# Patient Record
Sex: Male | Born: 1965 | Race: White | Hispanic: No | Marital: Married | Smoking: Never smoker
Health system: Southern US, Community
[De-identification: ages and names within clinical notes are randomized; demographics above are authoritative.]

## PROBLEM LIST (undated history)

## (undated) DIAGNOSIS — E559 Vitamin D deficiency, unspecified: Secondary | ICD-10-CM

## (undated) DIAGNOSIS — E0500 Thyrotoxicosis with diffuse goiter without thyrotoxic crisis or storm: Secondary | ICD-10-CM

## (undated) DIAGNOSIS — E039 Hypothyroidism, unspecified: Secondary | ICD-10-CM

## (undated) DIAGNOSIS — J309 Allergic rhinitis, unspecified: Secondary | ICD-10-CM

## (undated) DIAGNOSIS — S5400XA Injury of ulnar nerve at forearm level, unspecified arm, initial encounter: Secondary | ICD-10-CM

## (undated) DIAGNOSIS — E785 Hyperlipidemia, unspecified: Secondary | ICD-10-CM

## (undated) DIAGNOSIS — Z923 Personal history of irradiation: Secondary | ICD-10-CM

## (undated) HISTORY — DX: Allergic rhinitis, unspecified: J30.9

## (undated) HISTORY — DX: Thyrotoxicosis with diffuse goiter without thyrotoxic crisis or storm: E05.00

## (undated) HISTORY — DX: Hypothyroidism, unspecified: E03.9

## (undated) HISTORY — DX: Injury of ulnar nerve at forearm level, unspecified arm, initial encounter: S54.00XA

## (undated) HISTORY — DX: Hyperlipidemia, unspecified: E78.5

## (undated) HISTORY — DX: Personal history of irradiation: Z92.3

## (undated) HISTORY — DX: Vitamin D deficiency, unspecified: E55.9

---
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
## 1988-08-27 HISTORY — PX: HAND SURGERY: SHX662

## 1994-08-27 HISTORY — PX: OTHER SURGICAL HISTORY: SHX169

## 2008-11-10 HISTORY — PX: LACERATION REPAIR: SHX5168

## 2016-04-12 ENCOUNTER — Encounter: Payer: Self-pay | Admitting: Cardiovascular Disease

## 2016-04-19 ENCOUNTER — Ambulatory Visit: Payer: Self-pay | Admitting: Cardiovascular Disease

## 2016-04-20 ENCOUNTER — Encounter (INDEPENDENT_AMBULATORY_CARE_PROVIDER_SITE_OTHER): Payer: Self-pay

## 2016-04-20 ENCOUNTER — Encounter: Payer: Self-pay | Admitting: Cardiovascular Disease

## 2016-04-20 ENCOUNTER — Ambulatory Visit (INDEPENDENT_AMBULATORY_CARE_PROVIDER_SITE_OTHER): Payer: BLUE CROSS/BLUE SHIELD | Admitting: Cardiovascular Disease

## 2016-04-20 VITALS — BP 120/78 | HR 89 | Ht 72.0 in | Wt 228.3 lb

## 2016-04-20 DIAGNOSIS — R0789 Other chest pain: Secondary | ICD-10-CM

## 2016-04-20 NOTE — Progress Notes (Signed)
Cardiology Office Note Date:  04/20/2016   ID:  Philip Miller, DOB 1965/11/15, MRN [id number]  PCP:  London Pepper, MD  Cardiologist:  Sherren Mocha, MD    Chief Complaint  Patient presents with  . New Patient (Initial Visit)    family Hx      History of Present Illness: Philip Miller is a 50 y.o. male who presents for initial cardiac evaluation. He is well-known to me as I have taken care of multiple family members. He is concerned about cardiovascular risk because of a strong family hx of CAD in his parents as well as other relatives (uncles).   He swims three days per week. He has no exertional symptoms unless he really pushes it. He works as an Art gallery manager and at times he is under a great deal of stress. He used to run for exercise but has been limited by bone spurs.   He had chest tightness one year ago when he was under a lot of stress. This has completely resolved. He has never had exertional chest tightness. No dyspnea, edema, palpitations. No other complaints.   He is treated for hypertension and has been on Diovan for about one year. He is treated for hypercholesterolemia and thinks his numbers have been very good but he doesn't know the specifics. He's been on a statin drug for several years. Lipids have been followed by Dr Altheimer. He sees him every 6 months with hx of Grave's disease.   Past Medical History:  Diagnosis Date  . Allergic rhinitis   . Dyslipidemia   . Graves disease   . Hypothyroidism   . S/P radioactive iodine thyroid ablation   . Ulnar nerve injury    left side  . Vitamin D deficiency     Past Surgical History:  Procedure Laterality Date  . colonscopy  1996  . HAND SURGERY Right 1990   place pin in hand  . LACERATION REPAIR Left 11/10/2008  . WISDOM TOOTH EXTRACTION  1986    Current Outpatient Prescriptions  Medication Sig Dispense Refill  . atorvastatin (LIPITOR) 20 MG tablet Take 20 mg by mouth daily.    . Cholecalciferol  (VITAMIN D3) 5000 units CAPS Take 5,000 Units by mouth daily.    . fluticasone (VERAMYST) 27.5 MCG/SPRAY nasal spray Place 2 sprays into the nose daily.    Marland Kitchen levothyroxine (SYNTHROID, LEVOTHROID) 200 MCG tablet Take 200 mcg by mouth daily before breakfast.    . loratadine (CLARITIN) 10 MG tablet Take 10 mg by mouth daily.    . Multiple Vitamin (MULTIVITAMIN) capsule Take 1 capsule by mouth daily.    . valACYclovir (VALTREX) 500 MG tablet TAKE 2 TABLETS BY MOUTH DAILY EVERY 12 HRS AS NEEDED    . valsartan (DIOVAN) 40 MG tablet Take 40 mg by mouth daily.     No current facility-administered medications for this visit.     Allergies:   Review of patient's allergies indicates no known allergies.   Social History:  The patient  reports that he has never smoked. He has never used smokeless tobacco. He reports that he drinks alcohol. He reports that he does not use drugs.   Family History:  The patient's family history includes Diabetes type I in his cousin; Healthy in his sister; Heart attack in his maternal grandfather; Heart disease in his mother; Hyperthyroidism in his paternal grandmother; Hypothyroidism in his maternal aunt; Lymphoma in his maternal grandmother.    ROS:  Please see the history of present  illness. All other systems are reviewed and negative.    PHYSICAL EXAM: VS:  BP 120/78   Pulse 89   Ht 6' (1.829 m)   Wt 103.6 kg (228 lb 4.8 oz)   BMI 30.96 kg/m  , BMI Body mass index is 30.96 kg/m. GEN: Well nourished, well developed, in no acute distress  HEENT: normal  Neck: no JVD, no masses. No carotid bruits Cardiac: RRR without murmur or gallop                Respiratory:  clear to auscultation bilaterally, normal work of breathing GI: soft, nontender, nondistended, + BS MS: no deformity or atrophy  Ext: no pretibial edema, pedal pulses 2+= bilaterally Skin: warm and dry, no rash Neuro:  Strength and sensation are intact Psych: euthymic mood, full affect  EKG:  EKG is  ordered today. The ekg ordered today shows NSR 89 bpm, within normal limits  Recent Labs: No results found for requested labs within last 8760 hours.   Lipid Panel  No results found for: CHOL, TRIG, HDL, CHOLHDL, VLDL, LDLCALC, LDLDIRECT    Wt Readings from Last 3 Encounters:  04/20/16 103.6 kg (228 lb 4.8 oz)     ASSESSMENT AND PLAN: 1.  HTN: BP controlled on Diovan. Continue same.   2. Hyperlipidemia: treated with atorvastatin. Lipids followed by Dr Altheimer. Lifestyle modification reviewed at length.   3. Chest pain: atypical, stress related. Check exercise treadmill study (non-imaging).  Check coronary Ca CT scan to help risk stratify. Consider adding ASA 81 mg depending on test results.   Current medicines are reviewed with the patient today.  The patient does not have concerns regarding medicines.  Labs/ tests ordered today include:   Orders Placed This Encounter  Procedures  . CT CARDIAC SCORING  . Exercise Tolerance Test  . EKG 12-Lead    Disposition:   FU one year  Signed, Sherren Mocha, MD  04/20/2016 10:37 PM    [facility] [address], [place], [place]  [address] Phone: [phone]; Fax: [phone]

## 2016-04-20 NOTE — Patient Instructions (Signed)
Medication Instructions:  Your physician recommends that you continue on your current medications as directed. Please refer to the Current Medication list given to you today.  Labwork: No new orders.   Testing/Procedures: Your physician has requested that you have an exercise tolerance test. For further information please visit [email or web address]. Please also follow instruction sheet, as given.  Your physician has recommended a Calcium Score ($150 out of pocket)  Follow-Up: Your physician wants you to follow-up in: 1 YEAR with Dr Burt Knack.  You will receive a reminder letter in the mail two months in advance. If you don't receive a letter, please call our office to schedule the follow-up appointment.   Any Other Special Instructions Will Be Listed Below (If Applicable).     If you need a refill on your cardiac medications before your next appointment, please call your pharmacy.

## 2016-05-02 ENCOUNTER — Encounter: Payer: Self-pay | Admitting: Cardiovascular Disease

## 2016-05-04 ENCOUNTER — Ambulatory Visit (INDEPENDENT_AMBULATORY_CARE_PROVIDER_SITE_OTHER): Payer: BLUE CROSS/BLUE SHIELD

## 2016-05-04 ENCOUNTER — Ambulatory Visit (INDEPENDENT_AMBULATORY_CARE_PROVIDER_SITE_OTHER)
Admission: RE | Admit: 2016-05-04 | Discharge: 2016-05-04 | Disposition: A | Discharge status: Home / Self Care | Payer: BLUE CROSS/BLUE SHIELD | Source: Ambulatory Visit | Attending: Cardiovascular Disease | Admitting: Cardiovascular Disease

## 2016-05-04 DIAGNOSIS — R0789 Other chest pain: Secondary | ICD-10-CM

## 2016-05-04 LAB — EXERCISE TOLERANCE TEST
CSEPEDS: 0 s
CSEPEW: 10.2 METS
CSEPHR: 91 %
CSEPPHR: 155 {beats}/min
Exercise duration (min): 9 min
MPHR: 170 {beats}/min
RPE: 15
Rest HR: 94 {beats}/min

## 2016-05-17 ENCOUNTER — Other Ambulatory Visit: Payer: Self-pay

## 2016-05-17 MED ORDER — ASPIRIN EC 81 MG PO TBEC: 81.0000 mg | DELAYED_RELEASE_TABLET | Freq: Every day | ORAL | Status: AC

## 2016-08-31 DIAGNOSIS — J301 Allergic rhinitis due to pollen: Secondary | ICD-10-CM | POA: Diagnosis not present

## 2016-08-31 DIAGNOSIS — J3089 Other allergic rhinitis: Secondary | ICD-10-CM | POA: Diagnosis not present

## 2016-09-06 DIAGNOSIS — J301 Allergic rhinitis due to pollen: Secondary | ICD-10-CM | POA: Diagnosis not present

## 2016-09-06 DIAGNOSIS — J3089 Other allergic rhinitis: Secondary | ICD-10-CM | POA: Diagnosis not present

## 2016-09-27 DIAGNOSIS — J301 Allergic rhinitis due to pollen: Secondary | ICD-10-CM | POA: Diagnosis not present

## 2016-09-27 DIAGNOSIS — J3089 Other allergic rhinitis: Secondary | ICD-10-CM | POA: Diagnosis not present

## 2016-10-05 DIAGNOSIS — J301 Allergic rhinitis due to pollen: Secondary | ICD-10-CM | POA: Diagnosis not present

## 2016-10-05 DIAGNOSIS — J3089 Other allergic rhinitis: Secondary | ICD-10-CM | POA: Diagnosis not present

## 2016-10-12 DIAGNOSIS — J3089 Other allergic rhinitis: Secondary | ICD-10-CM | POA: Diagnosis not present

## 2016-10-12 DIAGNOSIS — J301 Allergic rhinitis due to pollen: Secondary | ICD-10-CM | POA: Diagnosis not present

## 2016-10-19 DIAGNOSIS — J3089 Other allergic rhinitis: Secondary | ICD-10-CM | POA: Diagnosis not present

## 2016-10-19 DIAGNOSIS — J301 Allergic rhinitis due to pollen: Secondary | ICD-10-CM | POA: Diagnosis not present

## 2016-11-09 DIAGNOSIS — J3089 Other allergic rhinitis: Secondary | ICD-10-CM | POA: Diagnosis not present

## 2016-11-09 DIAGNOSIS — J301 Allergic rhinitis due to pollen: Secondary | ICD-10-CM | POA: Diagnosis not present

## 2016-11-12 DIAGNOSIS — Z0000 Encounter for general adult medical examination without abnormal findings: Secondary | ICD-10-CM | POA: Diagnosis not present

## 2016-11-12 DIAGNOSIS — E782 Mixed hyperlipidemia: Secondary | ICD-10-CM | POA: Diagnosis not present

## 2016-11-12 DIAGNOSIS — E0500 Thyrotoxicosis with diffuse goiter without thyrotoxic crisis or storm: Secondary | ICD-10-CM | POA: Diagnosis not present

## 2016-11-12 DIAGNOSIS — Z125 Encounter for screening for malignant neoplasm of prostate: Secondary | ICD-10-CM | POA: Diagnosis not present

## 2016-11-12 DIAGNOSIS — E559 Vitamin D deficiency, unspecified: Secondary | ICD-10-CM | POA: Diagnosis not present

## 2016-11-16 DIAGNOSIS — I10 Essential (primary) hypertension: Secondary | ICD-10-CM | POA: Insufficient documentation

## 2016-11-16 DIAGNOSIS — E782 Mixed hyperlipidemia: Secondary | ICD-10-CM | POA: Insufficient documentation

## 2016-11-16 DIAGNOSIS — E559 Vitamin D deficiency, unspecified: Secondary | ICD-10-CM | POA: Insufficient documentation

## 2016-11-16 DIAGNOSIS — E890 Postprocedural hypothyroidism: Secondary | ICD-10-CM | POA: Diagnosis not present

## 2016-11-16 DIAGNOSIS — E0500 Thyrotoxicosis with diffuse goiter without thyrotoxic crisis or storm: Secondary | ICD-10-CM | POA: Insufficient documentation

## 2016-11-16 DIAGNOSIS — R7301 Impaired fasting glucose: Secondary | ICD-10-CM | POA: Insufficient documentation

## 2016-11-30 DIAGNOSIS — J301 Allergic rhinitis due to pollen: Secondary | ICD-10-CM | POA: Diagnosis not present

## 2016-11-30 DIAGNOSIS — J3089 Other allergic rhinitis: Secondary | ICD-10-CM | POA: Diagnosis not present

## 2016-12-07 DIAGNOSIS — J301 Allergic rhinitis due to pollen: Secondary | ICD-10-CM | POA: Diagnosis not present

## 2016-12-07 DIAGNOSIS — J3089 Other allergic rhinitis: Secondary | ICD-10-CM | POA: Diagnosis not present

## 2016-12-09 IMAGING — CT CT HEART SCORING
2 series · 16 of 20 positions shown, 18 images · non-contrast
Comparison: None.

CLINICAL DATA: Risk stratification

EXAM:
Coronary Calcium Score
TECHNIQUE: The patient was scanned on a Siemens Somatom 64 slice scanner. Axial
non-contrast 3mm slices were carried out through the heart. The data
set was analyzed on a dedicated work station and scored using the
Agatson method.

[Series 2: casc 3.0 i36f 2 bestdiast 67 % · axial · 0.36mm/px · z∈[-253,-163]mm · 8 of 40 slices shown, 10 images]
[im 5/40  vessel]
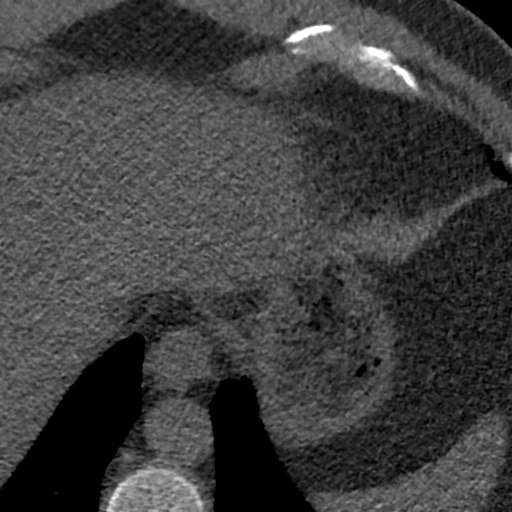
[im 5/40  lung]
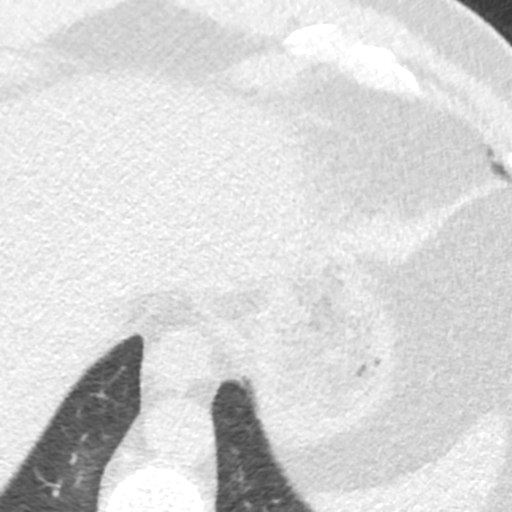
[im 9/40  vessel]
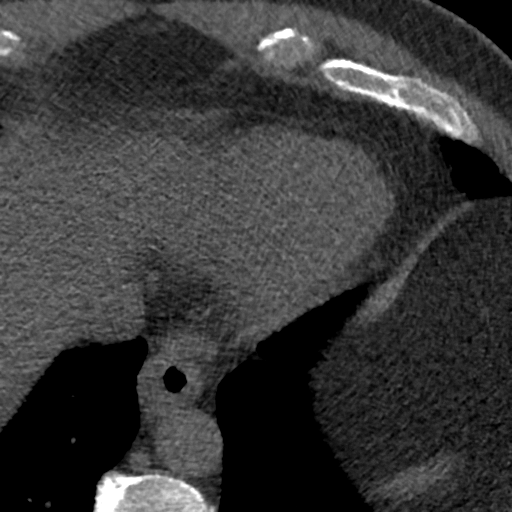
[im 14/40  vessel]
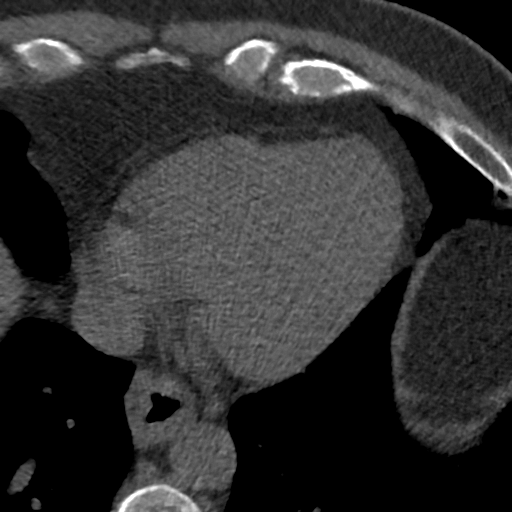
[im 18/40  vessel]
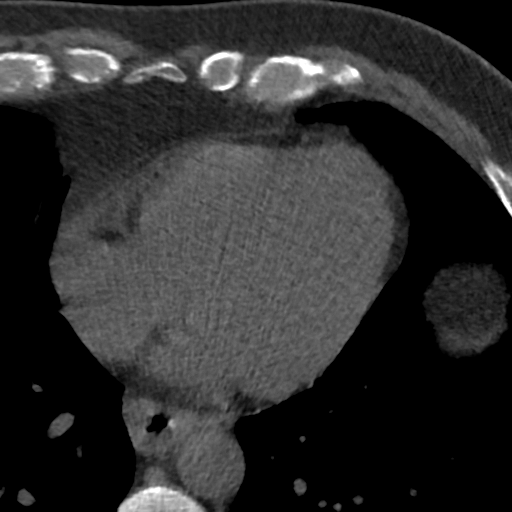
[im 22/40  vessel]
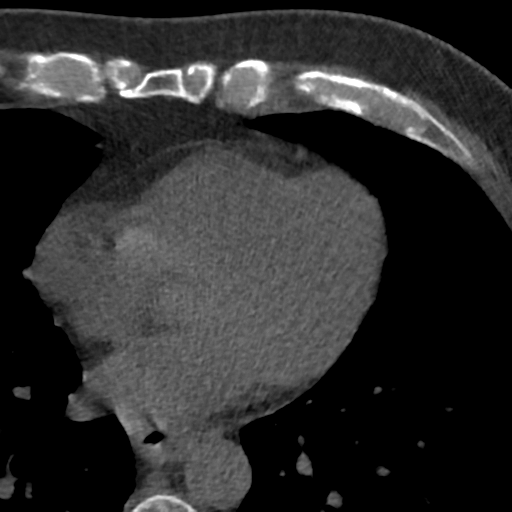
[im 22/40  lung]
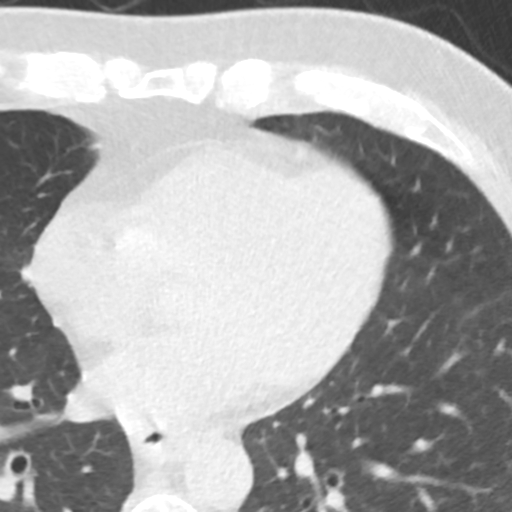
[im 27/40  vessel]
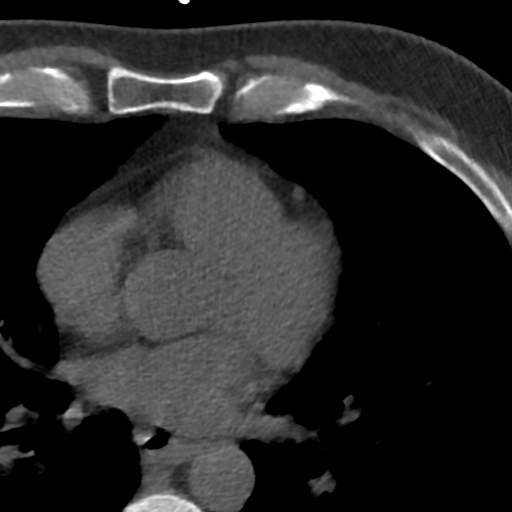
[im 31/40  vessel]
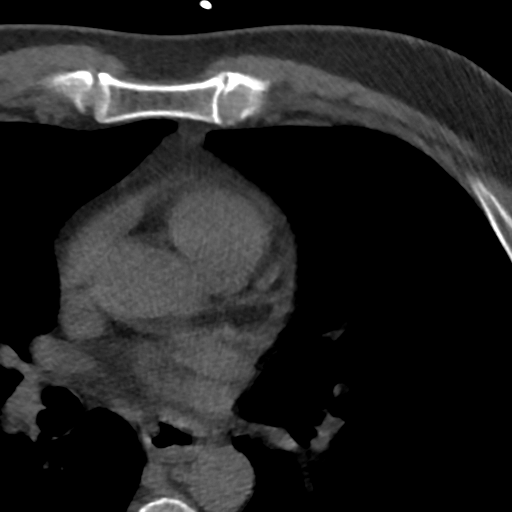
[im 35/40  vessel]
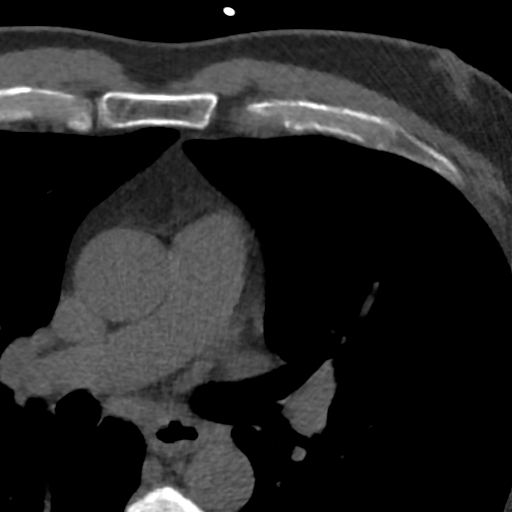

[Series 4: lung st 72 % · axial · 0.70mm/px · z∈[-256,-162]mm · 8 of 41 slices shown]
[im 5/41  lung]
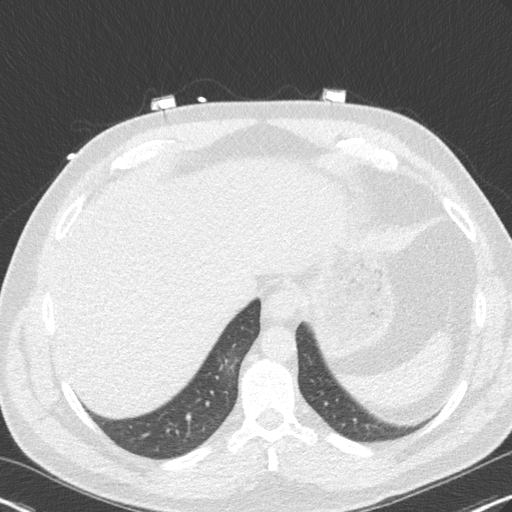
[im 9/41  lung]
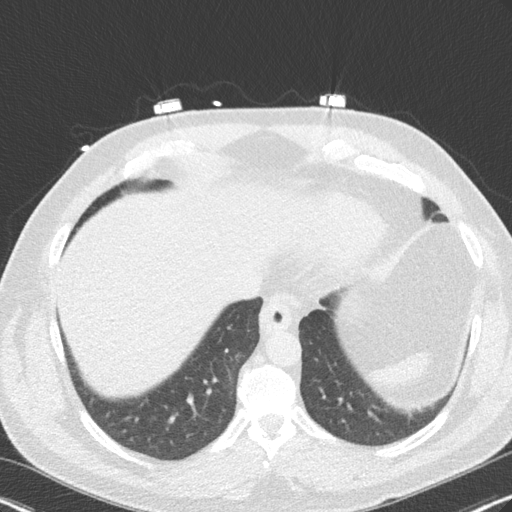
[im 14/41  lung]
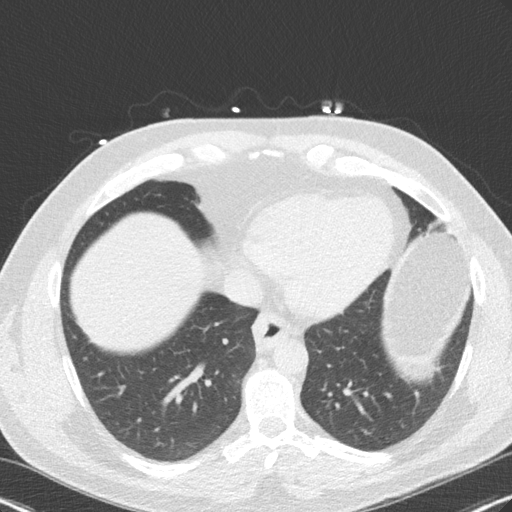
[im 18/41  lung]
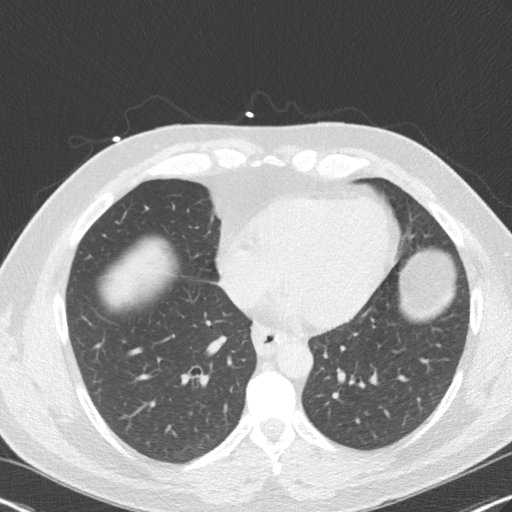
[im 23/41  lung]
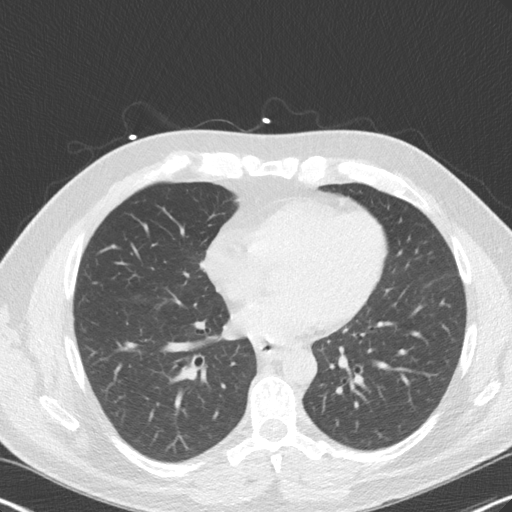
[im 27/41  lung]
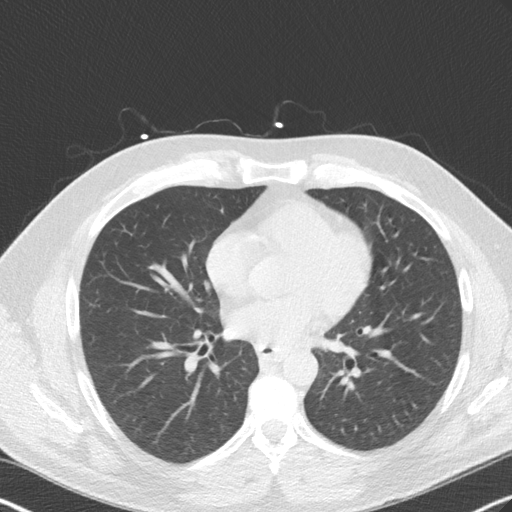
[im 32/41  lung]
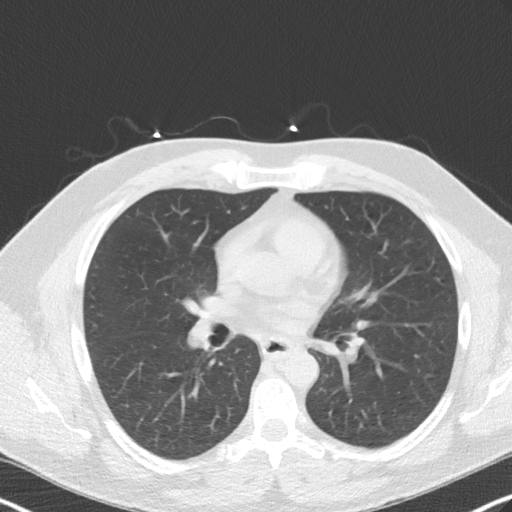
[im 36/41  lung]
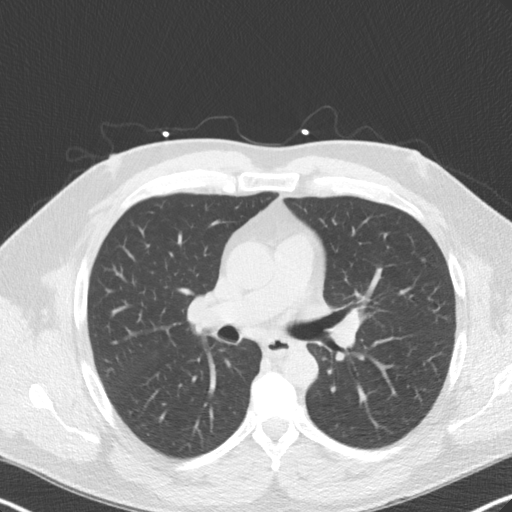

[16 of 20 positions shown; findings below may reference images not displayed]

FINDINGS: Non-cardiac: Distal esophageal thickening See separate report from

Ascending Aorta:  3.4 cm

Pericardium: Normal

Coronary arteries:  Mild calcium seen in proximal to mid RCA
IMPRESSION: Coronary calcium score of 7. This was 52nd percentile for age and
sex matched control.

Murillo Barrow

EXAM:
OVER-READ INTERPRETATION  CT CHEST

The following report is an over-read performed by radiologist Dr.
over-read does not include interpretation of cardiac or coronary
anatomy or pathology. The coronary calcium score interpretation by
the cardiologist is attached.
FINDINGS: Limited view of the lung parenchyma demonstrates no suspicious
nodularity. Airways are normal.

Limited view of the mediastinum demonstrates no adenopathy.

Limited view of the upper abdomen demonstrates mild circumferential
thickening of the distal esophagus to 9 mm.

Limited view of the skeleton and chest wall is unremarkable.
IMPRESSION: 1. Circumferential thickening of the distal esophagus. Recommend
clinical correlation with dysphagia and consider evaluation for
esophagitis.
2. No additional significant extracardiac findings.

## 2016-12-14 DIAGNOSIS — J301 Allergic rhinitis due to pollen: Secondary | ICD-10-CM | POA: Diagnosis not present

## 2016-12-14 DIAGNOSIS — J3089 Other allergic rhinitis: Secondary | ICD-10-CM | POA: Diagnosis not present

## 2016-12-21 DIAGNOSIS — J301 Allergic rhinitis due to pollen: Secondary | ICD-10-CM | POA: Diagnosis not present

## 2016-12-21 DIAGNOSIS — J3089 Other allergic rhinitis: Secondary | ICD-10-CM | POA: Diagnosis not present

## 2016-12-28 DIAGNOSIS — J301 Allergic rhinitis due to pollen: Secondary | ICD-10-CM | POA: Diagnosis not present

## 2016-12-28 DIAGNOSIS — J3089 Other allergic rhinitis: Secondary | ICD-10-CM | POA: Diagnosis not present

## 2017-01-04 DIAGNOSIS — J3089 Other allergic rhinitis: Secondary | ICD-10-CM | POA: Diagnosis not present

## 2017-01-04 DIAGNOSIS — J301 Allergic rhinitis due to pollen: Secondary | ICD-10-CM | POA: Diagnosis not present

## 2017-01-11 DIAGNOSIS — J301 Allergic rhinitis due to pollen: Secondary | ICD-10-CM | POA: Diagnosis not present

## 2017-01-11 DIAGNOSIS — J3089 Other allergic rhinitis: Secondary | ICD-10-CM | POA: Diagnosis not present

## 2017-01-18 DIAGNOSIS — J3089 Other allergic rhinitis: Secondary | ICD-10-CM | POA: Diagnosis not present

## 2017-01-18 DIAGNOSIS — J301 Allergic rhinitis due to pollen: Secondary | ICD-10-CM | POA: Diagnosis not present

## 2017-01-25 DIAGNOSIS — J3089 Other allergic rhinitis: Secondary | ICD-10-CM | POA: Diagnosis not present

## 2017-01-25 DIAGNOSIS — J301 Allergic rhinitis due to pollen: Secondary | ICD-10-CM | POA: Diagnosis not present

## 2017-02-01 DIAGNOSIS — J301 Allergic rhinitis due to pollen: Secondary | ICD-10-CM | POA: Diagnosis not present

## 2017-02-01 DIAGNOSIS — J3089 Other allergic rhinitis: Secondary | ICD-10-CM | POA: Diagnosis not present

## 2017-02-14 DIAGNOSIS — J3089 Other allergic rhinitis: Secondary | ICD-10-CM | POA: Diagnosis not present

## 2017-02-14 DIAGNOSIS — J301 Allergic rhinitis due to pollen: Secondary | ICD-10-CM | POA: Diagnosis not present

## 2017-02-22 DIAGNOSIS — J3089 Other allergic rhinitis: Secondary | ICD-10-CM | POA: Diagnosis not present

## 2017-02-22 DIAGNOSIS — J301 Allergic rhinitis due to pollen: Secondary | ICD-10-CM | POA: Diagnosis not present

## 2017-03-08 DIAGNOSIS — J301 Allergic rhinitis due to pollen: Secondary | ICD-10-CM | POA: Diagnosis not present

## 2017-03-08 DIAGNOSIS — J3089 Other allergic rhinitis: Secondary | ICD-10-CM | POA: Diagnosis not present

## 2017-03-14 DIAGNOSIS — J301 Allergic rhinitis due to pollen: Secondary | ICD-10-CM | POA: Diagnosis not present

## 2017-03-14 DIAGNOSIS — J3089 Other allergic rhinitis: Secondary | ICD-10-CM | POA: Diagnosis not present

## 2017-03-22 DIAGNOSIS — J3089 Other allergic rhinitis: Secondary | ICD-10-CM | POA: Diagnosis not present

## 2017-03-22 DIAGNOSIS — J301 Allergic rhinitis due to pollen: Secondary | ICD-10-CM | POA: Diagnosis not present

## 2017-03-25 DIAGNOSIS — J301 Allergic rhinitis due to pollen: Secondary | ICD-10-CM | POA: Diagnosis not present

## 2017-03-25 DIAGNOSIS — J3081 Allergic rhinitis due to animal (cat) (dog) hair and dander: Secondary | ICD-10-CM | POA: Diagnosis not present

## 2017-04-09 DIAGNOSIS — J3089 Other allergic rhinitis: Secondary | ICD-10-CM | POA: Diagnosis not present

## 2017-04-09 DIAGNOSIS — J301 Allergic rhinitis due to pollen: Secondary | ICD-10-CM | POA: Diagnosis not present

## 2017-04-23 DIAGNOSIS — J3089 Other allergic rhinitis: Secondary | ICD-10-CM | POA: Diagnosis not present

## 2017-04-23 DIAGNOSIS — J301 Allergic rhinitis due to pollen: Secondary | ICD-10-CM | POA: Diagnosis not present

## 2017-04-25 ENCOUNTER — Ambulatory Visit (INDEPENDENT_AMBULATORY_CARE_PROVIDER_SITE_OTHER): Payer: 59 | Admitting: Cardiovascular Disease

## 2017-04-25 ENCOUNTER — Encounter: Payer: Self-pay | Admitting: Cardiovascular Disease

## 2017-04-25 VITALS — BP 132/80 | HR 80 | Ht 72.0 in | Wt 222.8 lb

## 2017-04-25 DIAGNOSIS — I10 Essential (primary) hypertension: Secondary | ICD-10-CM | POA: Diagnosis not present

## 2017-04-25 NOTE — Progress Notes (Signed)
Cardiology Office Note Date:  04/25/2017   ID:  Philip Miller, DOB 02-20-1966, MRN [id number]  PCP:  London Pepper, MD  Cardiologist:  Sherren Mocha, MD    Chief Complaint  Patient presents with  . Follow-up     History of Present Illness: Philip Miller is a 51 y.o. male who presents for follow-up evaluation. The patient was initially seen last year for evaluation of chest pain. He had some chest discomfort associated with emotional stress. He has never had exertional angina and he is active, swimming 3 days per week. He underwent an exercise treadmill study demonstrating normal exercise tolerance and no ischemic changes. A CT calcium score was 7  He feels well. Here alone today. He is on the keto diet. Just started the diet and has lost about 8 pounds. Today, he denies symptoms of palpitations, chest pain, shortness of breath, orthopnea, PND, lower extremity edema, dizziness, or syncope.   Past Medical History:  Diagnosis Date  . Allergic rhinitis   . Dyslipidemia   . Graves disease   . Hypothyroidism   . S/P radioactive iodine thyroid ablation   . Ulnar nerve injury    left side  . Vitamin D deficiency     Past Surgical History:  Procedure Laterality Date  . colonscopy  1996  . HAND SURGERY Right 1990   place pin in hand  . LACERATION REPAIR Left 11/10/2008  . WISDOM TOOTH EXTRACTION  1986    Current Outpatient Prescriptions  Medication Sig Dispense Refill  . aspirin EC 81 MG tablet Take 1 tablet (81 mg total) by mouth daily.    Marland Kitchen atorvastatin (LIPITOR) 20 MG tablet Take 20 mg by mouth daily.    . Cholecalciferol (VITAMIN D3) 5000 units CAPS Take 5,000 Units by mouth daily.    . fluticasone (VERAMYST) 27.5 MCG/SPRAY nasal spray Place 2 sprays into the nose daily.    Marland Kitchen levothyroxine (SYNTHROID, LEVOTHROID) 200 MCG tablet Take 200 mcg by mouth daily before breakfast.    . loratadine (CLARITIN) 10 MG tablet Take 10 mg by mouth daily.    . Multiple Vitamin  (MULTIVITAMIN) capsule Take 1 capsule by mouth daily.    Marland Kitchen olmesartan (BENICAR) 20 MG tablet Take 20 mg by mouth daily.    . valACYclovir (VALTREX) 500 MG tablet 1,000 mg 2 (two) times daily as needed (fever blister outbreaks). TAKE 2 TABLETS BY MOUTH DAILY EVERY 12 HRS AS NEEDED      No current facility-administered medications for this visit.     Allergies:   Patient has no known allergies.   Social History:  The patient  reports that he has never smoked. He has never used smokeless tobacco. He reports that he drinks alcohol. He reports that he does not use drugs.   Family History:  The patient's  family history includes Diabetes type I in his cousin; Healthy in his sister; Heart attack in his maternal grandfather; Heart disease in his mother; Hyperthyroidism in his paternal grandmother; Hypothyroidism in his maternal aunt; Lymphoma in his maternal grandmother.    ROS:  Please see the history of present illness.  All other systems are reviewed and negative.    PHYSICAL EXAM: VS:  BP 132/80   Pulse 80   Ht 6' (1.829 m)   Wt 222 lb 12.8 oz (101.1 kg)   BMI 30.22 kg/m  , BMI Body mass index is 30.22 kg/m. GEN: Well nourished, well developed, in no acute distress  HEENT: normal  Neck: no  JVD, no masses. No carotid bruits Cardiac: RRR without murmur or gallop                Respiratory:  clear to auscultation bilaterally, normal work of breathing GI: soft, nontender, nondistended, + BS MS: no deformity or atrophy  Ext: no pretibial edema, pedal pulses 2+= bilaterally Skin: warm and dry, no rash Neuro:  Strength and sensation are intact Psych: euthymic mood, full affect  EKG:  EKG is ordered today. The ekg ordered today shows NSR 80 bpm, within normal limits  Recent Labs: No results found for requested labs within last 8760 hours.   Lipid Panel  No results found for: CHOL, TRIG, HDL, CHOLHDL, VLDL, LDLCALC, LDLDIRECT    Wt Readings from Last 3 Encounters:  04/25/17 222 lb  12.8 oz (101.1 kg)  04/20/16 228 lb 4.8 oz (103.6 kg)     Cardiac Studies Reviewed: Coronary CT calcium score 05/04/2016: FINDINGS: Non-cardiac: Distal esophageal thickening See separate report from [facility].  Ascending Aorta:  3.4 cm  Pericardium: Normal  Coronary arteries:  Mild calcium seen in proximal to mid RCA  IMPRESSION: Coronary calcium score of 7. This was 52nd percentile for age and sex matched control.  Exercise tolerance test: Study Highlights     Blood pressure demonstrated a hypertensive response to exercise.  No T wave inversion was noted during stress.  There was no ST segment deviation noted during stress.   Hypertensive response to exercise. Otherwise normal stress ECG test.   Stress Findings   ECG Baseline ECG exhibits normal sinus rhythm..    Stress Findings The patient exercised following the Bruce protocol.  The patient reported no symptoms during the stress test. The patient experienced no angina during the stress test.   Test was stopped per protocol.   Heart rate demonstrated a normal response to exercise. Blood pressure demonstrated a hypertensive response to exercise. Overall, the patient's exercise capacity was normal.   85% of maximum heart rate was achieved after 6.3 minutes. Recovery time: 5 minutes. The patient's response to exercise was adequate for diagnosis.    Response to Stress There was no ST segment deviation noted during stress.  No T wave inversion was noted during stress. Arrhythmias during stress: rare PACs.  Arrhythmias were not significant.  ECG was interpretable and there was no significant change from baseline.    Stress Measurements   Baseline Vitals  Rest HR 94 bpm    Rest BP 138/68 mmHg    Exercise Time  Exercise duration (min) 9 min    Exercise duration (sec) 0 sec    Peak Stress Vitals  Peak HR 155 bpm    Peak BP 190/118 mmHg    Exercise Data  MPHR 170 bpm    Percent HR 91 %     RPE 15     Estimated workload 10.2 METS        ASSESSMENT AND PLAN: 1.  HTN: BP controlled on olmesartan. Continue to work on lifestyle/diet.  2. Hyperlipidemia: continue atorvastatin. Most recent labs reviewed through [facility].   Overall he seems to be doing well. He is off to a good start with his diet. Advised to continue his good exercise program and I will see him back in one year.  Current medicines are reviewed with the patient today.  The patient does not have concerns regarding medicines.  Labs/ tests ordered today include:   Orders Placed This Encounter  Procedures  . EKG 12-Lead   Disposition:  FU one year  Signed, Sherren Mocha, MD  04/25/2017 9:35 AM    [facility] [place], [place], [place]  [address] Phone: [phone]; Fax: [phone]

## 2017-04-25 NOTE — Patient Instructions (Signed)

## 2017-05-03 DIAGNOSIS — J3089 Other allergic rhinitis: Secondary | ICD-10-CM | POA: Diagnosis not present

## 2017-05-03 DIAGNOSIS — J301 Allergic rhinitis due to pollen: Secondary | ICD-10-CM | POA: Diagnosis not present

## 2017-05-06 DIAGNOSIS — E559 Vitamin D deficiency, unspecified: Secondary | ICD-10-CM | POA: Diagnosis not present

## 2017-05-06 DIAGNOSIS — R7301 Impaired fasting glucose: Secondary | ICD-10-CM | POA: Diagnosis not present

## 2017-05-06 DIAGNOSIS — E890 Postprocedural hypothyroidism: Secondary | ICD-10-CM | POA: Diagnosis not present

## 2017-05-23 DIAGNOSIS — Z23 Encounter for immunization: Secondary | ICD-10-CM | POA: Diagnosis not present

## 2017-05-23 DIAGNOSIS — J301 Allergic rhinitis due to pollen: Secondary | ICD-10-CM | POA: Diagnosis not present

## 2017-05-23 DIAGNOSIS — E782 Mixed hyperlipidemia: Secondary | ICD-10-CM | POA: Diagnosis not present

## 2017-05-23 DIAGNOSIS — J3089 Other allergic rhinitis: Secondary | ICD-10-CM | POA: Diagnosis not present

## 2017-05-23 DIAGNOSIS — E890 Postprocedural hypothyroidism: Secondary | ICD-10-CM | POA: Diagnosis not present

## 2017-05-23 DIAGNOSIS — E0500 Thyrotoxicosis with diffuse goiter without thyrotoxic crisis or storm: Secondary | ICD-10-CM | POA: Diagnosis not present

## 2017-05-30 DIAGNOSIS — J301 Allergic rhinitis due to pollen: Secondary | ICD-10-CM | POA: Diagnosis not present

## 2017-05-30 DIAGNOSIS — J3089 Other allergic rhinitis: Secondary | ICD-10-CM | POA: Diagnosis not present

## 2017-06-04 DIAGNOSIS — J3089 Other allergic rhinitis: Secondary | ICD-10-CM | POA: Diagnosis not present

## 2017-06-04 DIAGNOSIS — J301 Allergic rhinitis due to pollen: Secondary | ICD-10-CM | POA: Diagnosis not present

## 2017-06-13 DIAGNOSIS — J301 Allergic rhinitis due to pollen: Secondary | ICD-10-CM | POA: Diagnosis not present

## 2017-06-13 DIAGNOSIS — J3089 Other allergic rhinitis: Secondary | ICD-10-CM | POA: Diagnosis not present

## 2017-06-18 DIAGNOSIS — J301 Allergic rhinitis due to pollen: Secondary | ICD-10-CM | POA: Diagnosis not present

## 2017-06-18 DIAGNOSIS — J3089 Other allergic rhinitis: Secondary | ICD-10-CM | POA: Diagnosis not present

## 2017-06-25 DIAGNOSIS — J3081 Allergic rhinitis due to animal (cat) (dog) hair and dander: Secondary | ICD-10-CM | POA: Diagnosis not present

## 2017-06-25 DIAGNOSIS — J301 Allergic rhinitis due to pollen: Secondary | ICD-10-CM | POA: Diagnosis not present

## 2017-06-25 DIAGNOSIS — J3089 Other allergic rhinitis: Secondary | ICD-10-CM | POA: Diagnosis not present

## 2017-07-01 DIAGNOSIS — J3089 Other allergic rhinitis: Secondary | ICD-10-CM | POA: Diagnosis not present

## 2017-07-12 DIAGNOSIS — J301 Allergic rhinitis due to pollen: Secondary | ICD-10-CM | POA: Diagnosis not present

## 2017-07-12 DIAGNOSIS — J3089 Other allergic rhinitis: Secondary | ICD-10-CM | POA: Diagnosis not present

## 2017-07-26 DIAGNOSIS — J301 Allergic rhinitis due to pollen: Secondary | ICD-10-CM | POA: Diagnosis not present

## 2017-07-26 DIAGNOSIS — J3089 Other allergic rhinitis: Secondary | ICD-10-CM | POA: Diagnosis not present

## 2017-07-26 DIAGNOSIS — J3081 Allergic rhinitis due to animal (cat) (dog) hair and dander: Secondary | ICD-10-CM | POA: Diagnosis not present

## 2017-08-09 DIAGNOSIS — J301 Allergic rhinitis due to pollen: Secondary | ICD-10-CM | POA: Diagnosis not present

## 2017-08-09 DIAGNOSIS — J3089 Other allergic rhinitis: Secondary | ICD-10-CM | POA: Diagnosis not present

## 2017-08-09 DIAGNOSIS — J3081 Allergic rhinitis due to animal (cat) (dog) hair and dander: Secondary | ICD-10-CM | POA: Diagnosis not present

## 2017-08-22 DIAGNOSIS — J301 Allergic rhinitis due to pollen: Secondary | ICD-10-CM | POA: Diagnosis not present

## 2017-08-22 DIAGNOSIS — J3089 Other allergic rhinitis: Secondary | ICD-10-CM | POA: Diagnosis not present

## 2017-08-22 DIAGNOSIS — J3081 Allergic rhinitis due to animal (cat) (dog) hair and dander: Secondary | ICD-10-CM | POA: Diagnosis not present

## 2017-09-20 DIAGNOSIS — J301 Allergic rhinitis due to pollen: Secondary | ICD-10-CM | POA: Diagnosis not present

## 2017-09-20 DIAGNOSIS — J3081 Allergic rhinitis due to animal (cat) (dog) hair and dander: Secondary | ICD-10-CM | POA: Diagnosis not present

## 2017-09-20 DIAGNOSIS — J3089 Other allergic rhinitis: Secondary | ICD-10-CM | POA: Diagnosis not present

## 2017-10-03 DIAGNOSIS — J301 Allergic rhinitis due to pollen: Secondary | ICD-10-CM | POA: Diagnosis not present

## 2017-10-03 DIAGNOSIS — J3089 Other allergic rhinitis: Secondary | ICD-10-CM | POA: Diagnosis not present

## 2017-11-08 DIAGNOSIS — R7301 Impaired fasting glucose: Secondary | ICD-10-CM | POA: Diagnosis not present

## 2017-11-08 DIAGNOSIS — E559 Vitamin D deficiency, unspecified: Secondary | ICD-10-CM | POA: Diagnosis not present

## 2017-11-08 DIAGNOSIS — E890 Postprocedural hypothyroidism: Secondary | ICD-10-CM | POA: Diagnosis not present

## 2017-11-21 DIAGNOSIS — E890 Postprocedural hypothyroidism: Secondary | ICD-10-CM | POA: Diagnosis not present

## 2017-11-21 DIAGNOSIS — E0500 Thyrotoxicosis with diffuse goiter without thyrotoxic crisis or storm: Secondary | ICD-10-CM | POA: Diagnosis not present

## 2017-11-21 DIAGNOSIS — E782 Mixed hyperlipidemia: Secondary | ICD-10-CM | POA: Diagnosis not present

## 2019-06-18 ENCOUNTER — Other Ambulatory Visit: Payer: Self-pay | Admitting: Family Medicine

## 2019-06-18 DIAGNOSIS — R7989 Other specified abnormal findings of blood chemistry: Secondary | ICD-10-CM

## 2019-06-26 ENCOUNTER — Ambulatory Visit
Admission: RE | Admit: 2019-06-26 | Discharge: 2019-06-26 | Disposition: A | Discharge status: Home / Self Care | Payer: 59 | Source: Ambulatory Visit | Attending: Family Medicine | Admitting: Family Medicine

## 2019-06-26 DIAGNOSIS — R7989 Other specified abnormal findings of blood chemistry: Secondary | ICD-10-CM

## 2019-10-08 ENCOUNTER — Other Ambulatory Visit: Payer: Self-pay

## 2019-10-08 ENCOUNTER — Ambulatory Visit (INDEPENDENT_AMBULATORY_CARE_PROVIDER_SITE_OTHER): Payer: 59 | Admitting: Cardiovascular Disease

## 2019-10-08 ENCOUNTER — Encounter: Payer: Self-pay | Admitting: Cardiovascular Disease

## 2019-10-08 VITALS — BP 158/76 | HR 85 | Ht 72.0 in | Wt 214.8 lb

## 2019-10-08 DIAGNOSIS — I10 Essential (primary) hypertension: Secondary | ICD-10-CM

## 2019-10-08 DIAGNOSIS — E782 Mixed hyperlipidemia: Secondary | ICD-10-CM | POA: Diagnosis not present

## 2019-10-08 NOTE — Patient Instructions (Signed)

## 2019-10-08 NOTE — Progress Notes (Signed)
Cardiology Office Note:    Date:  10/09/2019   ID:  Philip Miller, DOB [birth year], MRN [id number]  PCP:  Philip Pepper, MD  Cardiologist:  Philip Mocha, MD  Electrophysiologist:  None   Referring MD: Philip Pepper, MD   Chief Complaint  Patient presents with  . Hypertension    History of Present Illness:    Philip Miller is a 54 y.o. male with a hx of hypertension and mixed hyperlipidemia, presenting for follow-up evaluation.  The patient is here alone today.  He is doing well from a cardiac perspective.  He continues to exercise regularly without exertional symptoms.  He primarily swims for exercise, but he swims pretty vigorously at times with heart rate up into the 150s.  He specifically denies chest pain, chest pressure, heart palpitations, edema, orthopnea, or PND.  His blood pressure has been followed closely by Dr. Elyse Miller.  He is currently taking valsartan 160 mg daily.  He reports home readings generally in the 130s over 80s.  Past Medical History:  Diagnosis Date  . Allergic rhinitis   . Dyslipidemia   . Graves disease   . Hypothyroidism   . S/P radioactive iodine thyroid ablation   . Ulnar nerve injury    left side  . Vitamin D deficiency     Past Surgical History:  Procedure Laterality Date  . colonscopy  1996  . HAND SURGERY Right 1990   place pin in hand  . LACERATION REPAIR Left 11/10/2008  . WISDOM TOOTH EXTRACTION  1986    Current Medications: Current Meds  Medication Sig  . aspirin EC 81 MG tablet Take 1 tablet (81 mg total) by mouth daily.  Marland Kitchen atorvastatin (LIPITOR) 20 MG tablet Take 20 mg by mouth daily.  . Cholecalciferol (VITAMIN D3) 5000 units CAPS Take 5,000 Units by mouth daily.  . fluticasone (VERAMYST) 27.5 MCG/SPRAY nasal spray Place 2 sprays into the nose daily.  Marland Kitchen levothyroxine (SYNTHROID) 112 MCG tablet Take 2 tablets 6 days a week and just 1.5 tablets 1 day a week; in AM on empty stomach for thyroid (name brand only DAW)  .  loratadine (CLARITIN) 10 MG tablet Take 10 mg by mouth daily.  . Multiple Vitamin (MULTIVITAMIN) capsule Take 1 capsule by mouth daily.  . valACYclovir (VALTREX) 500 MG tablet 1,000 mg 2 (two) times daily as needed (fever blister outbreaks). TAKE 2 TABLETS BY MOUTH DAILY EVERY 12 HRS AS NEEDED   . valsartan (DIOVAN) 160 MG tablet Take 160 mg by mouth daily.     Allergies:   Patient has no known allergies.   Social History   Socioeconomic History  . Marital status: Married    Spouse name: Not on file  . Number of children: Not on file  . Years of education: Not on file  . Highest education level: Not on file  Occupational History  . Not on file  Tobacco Use  . Smoking status: Never Smoker  . Smokeless tobacco: Never Used  Substance and Sexual Activity  . Alcohol use: Yes    Comment: OCCASIONALLY  . Drug use: No  . Sexual activity: Yes  Other Topics Concern  . Not on file  Social History Narrative  . Not on file   Social Determinants of Health   Financial Resource Strain:   . Difficulty of Paying Living Expenses: Not on file  Food Insecurity:   . Worried About Charity fundraiser in the Last Year: Not on file  . Ran Out of  Food in the Last Year: Not on file  Transportation Needs:   . Lack of Transportation (Medical): Not on file  . Lack of Transportation (Non-Medical): Not on file  Physical Activity:   . Days of Exercise per Week: Not on file  . Minutes of Exercise per Session: Not on file  Stress:   . Feeling of Stress : Not on file  Social Connections:   . Frequency of Communication with Friends and Family: Not on file  . Frequency of Social Gatherings with Friends and Family: Not on file  . Attends Religious Services: Not on file  . Active Member of Clubs or Organizations: Not on file  . Attends Archivist Meetings: Not on file  . Marital Status: Not on file     Family History: The patient's family history includes Diabetes type I in his cousin;  Healthy in his sister; Heart attack in his maternal grandfather; Heart disease in his mother; Hyperthyroidism in his paternal grandmother; Hypothyroidism in his maternal aunt; Lymphoma in his maternal grandmother.  ROS:   Please see the history of present illness.    All other systems reviewed and are negative.  EKGs/Labs/Other Studies Reviewed:    EKG:  EKG is ordered today.  The ekg ordered today demonstrates normal sinus rhythm 85 bpm, within normal limits.  Recent Labs: No results found for requested labs within last 8760 hours.  Recent Lipid Panel No results found for: CHOL, TRIG, HDL, CHOLHDL, VLDL, LDLCALC, LDLDIRECT  Physical Exam:    VS:  BP (!) 158/76   Pulse 85   Ht 6' (1.829 m)   Wt 214 lb 12.8 oz (97.4 kg)   SpO2 96%   BMI 29.13 kg/m     Wt Readings from Last 3 Encounters:  10/08/19 214 lb 12.8 oz (97.4 kg)  04/25/17 222 lb 12.8 oz (101.1 kg)  04/20/16 228 lb 4.8 oz (103.6 kg)     GEN:  Well nourished, well developed in no acute distress HEENT: Normal NECK: No JVD; No carotid bruits LYMPHATICS: No lymphadenopathy CARDIAC: RRR, no murmurs, rubs, gallops RESPIRATORY:  Clear to auscultation without rales, wheezing or rhonchi  ABDOMEN: Soft, non-tender, non-distended MUSCULOSKELETAL:  No edema; No deformity  SKIN: Warm and dry NEUROLOGIC:  Alert and oriented x 3 PSYCHIATRIC:  Normal affect   ASSESSMENT:    1. Mixed hyperlipidemia   2. Essential hypertension    PLAN:    In order of problems listed above:  1. Recent labs reviewed through care everywhere.  Cholesterol is 168, direct LDL 93, HDL 61, triglycerides 161.  He continues on atorvastatin 20 mg daily.  Lifestyle modification is discussed.  He is going to work on weight loss.  He is also noted to have elevated transaminases with an AST of 58 and an ALT of 81.  A hepatic ultrasound was done and this is reviewed today.  This demonstrates increased echogenicity of the liver consistent with hepatic  steatosis. 2. Blood pressure is controlled on home readings.  He will work on diet and exercise with a goal for weight loss which should bring his blood pressure down even further.  He will continue valsartan.  He is followed by Dr. Elyse Miller.  Labs are reviewed with normal renal function (creatinine 0.94) and normal electrolytes with a potassium of 4.4 and sodium 141.   Medication Adjustments/Labs and Tests Ordered: Current medicines are reviewed at length with the patient today.  Concerns regarding medicines are outlined above.  Orders Placed This Encounter  Procedures  . EKG 12-Lead   No orders of the defined types were placed in this encounter.   Patient Instructions  Medication Instructions:  Your provider recommends that you continue on your current medications as directed. Please refer to the Current Medication list given to you today.   *If you need a refill on your cardiac medications before your next appointment, please call your pharmacy*  Follow-Up: At [facility], you and your health needs are our priority.  As part of our continuing mission to provide you with exceptional heart care, we have created designated Provider Care Teams.  These Care Teams include your primary Cardiologist (physician) and Advanced Practice Providers (APPs -  Physician Assistants and Nurse Practitioners) who all work together to provide you with the care you need, when you need it. Your next appointment:   12 month(s) The format for your next appointment:   In Person Provider:   You may see Philip Mocha, MD or one of the following Advanced Practice Providers on your designated Care Team:    Richardson Dopp, PA-C  Vin Westcreek, PA-C  Daune Perch, [place]    Signed, Philip Mocha, MD  10/09/2019 6:29 AM    [place]

## 2020-01-31 IMAGING — US US ABDOMEN LIMITED
1 series · 14 of 25 positions shown · non-contrast
Comparison: None.

CLINICAL DATA: Elevated liver enzymes

EXAM:
ULTRASOUND ABDOMEN LIMITED RIGHT UPPER QUADRANT

[Series 1: us abdomen limited · 0.15mm/px · 14 of 47 slices shown]
[im 1/47]
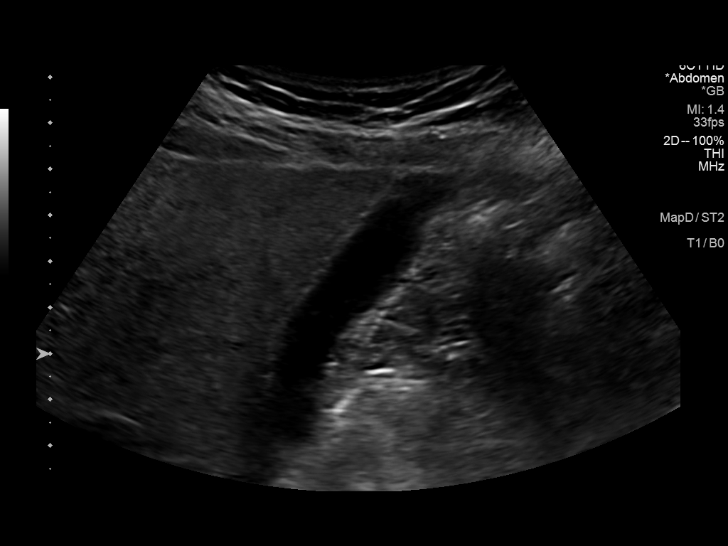
[im 4/47]
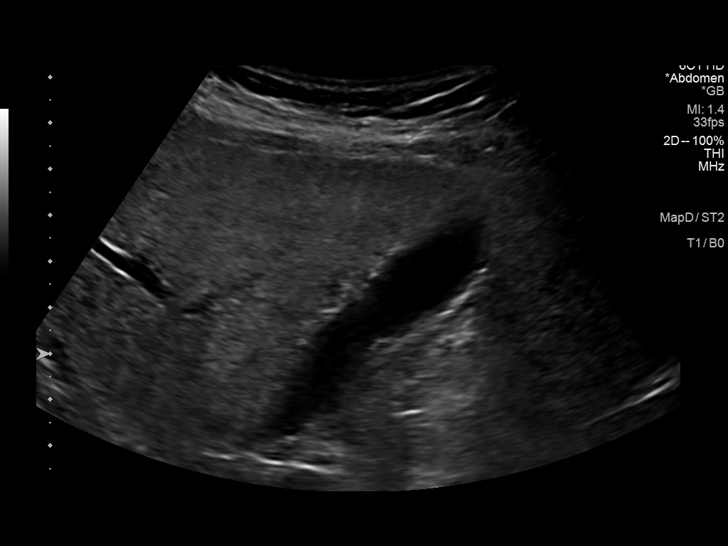
[im 8/47]
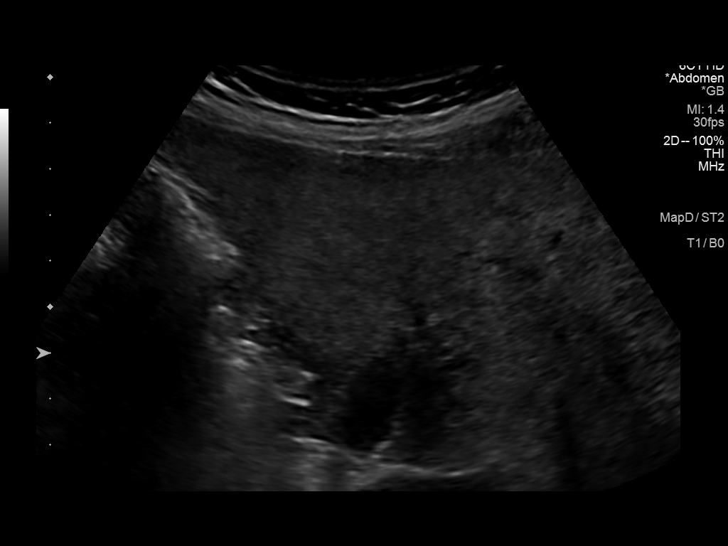
[im 12/47]
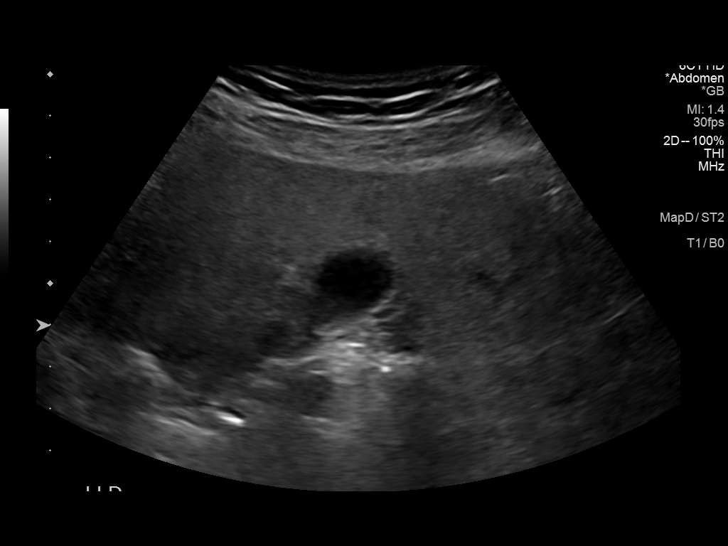
[im 16/47]
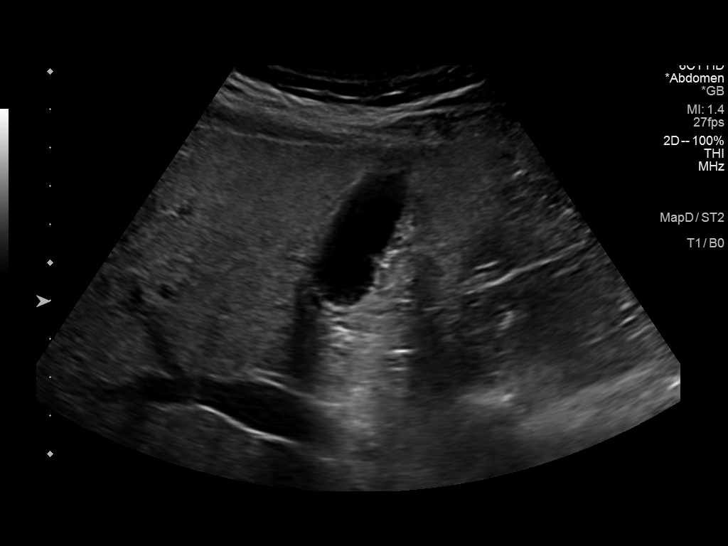
[im 18/47]
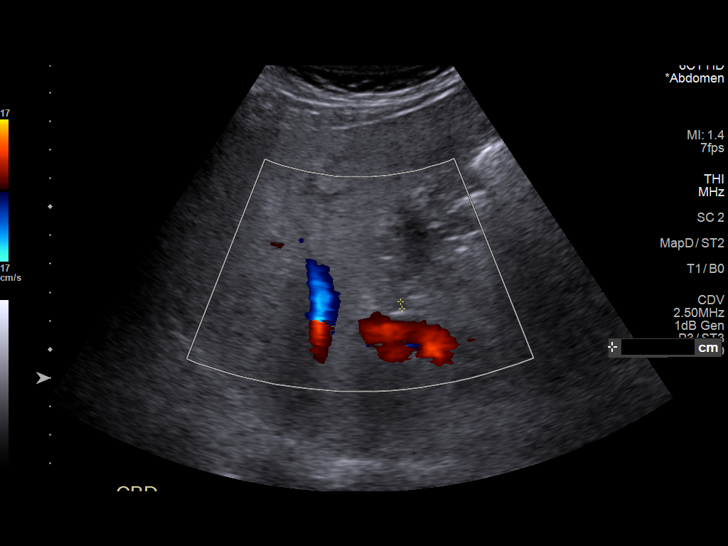
[im 22/47]
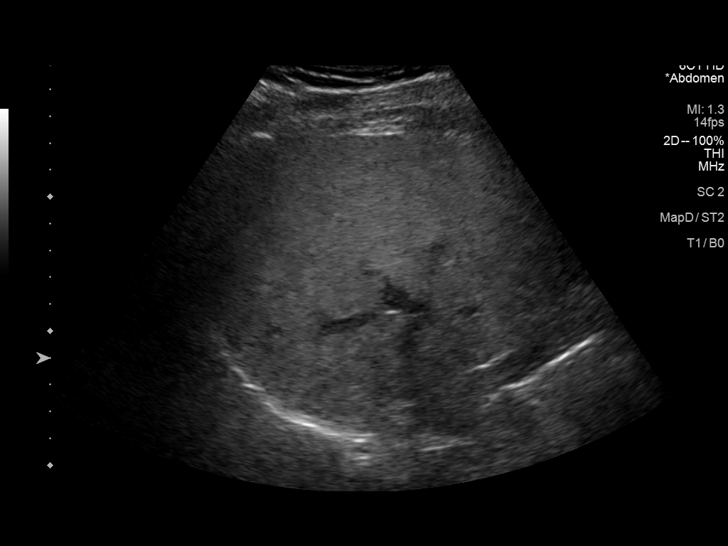
[im 25/47]
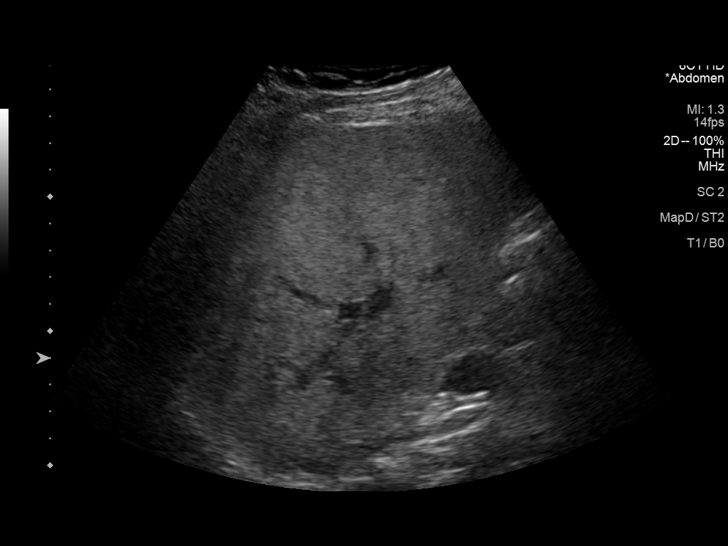
[im 29/47]
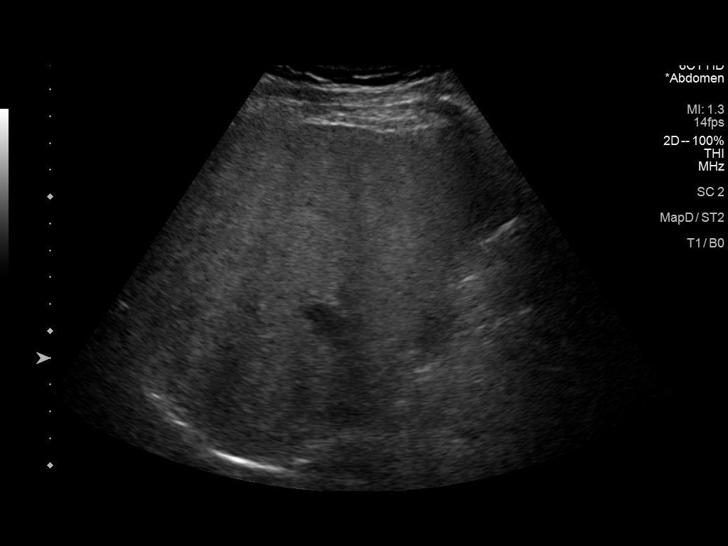
[im 31/47]
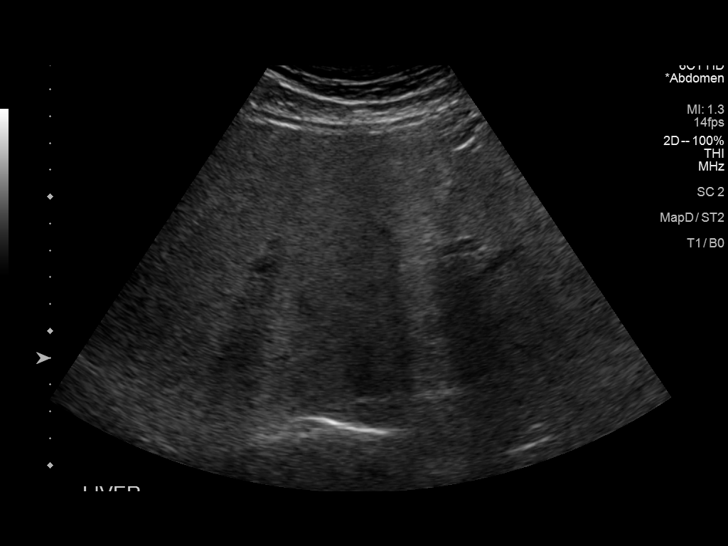
[im 35/47]
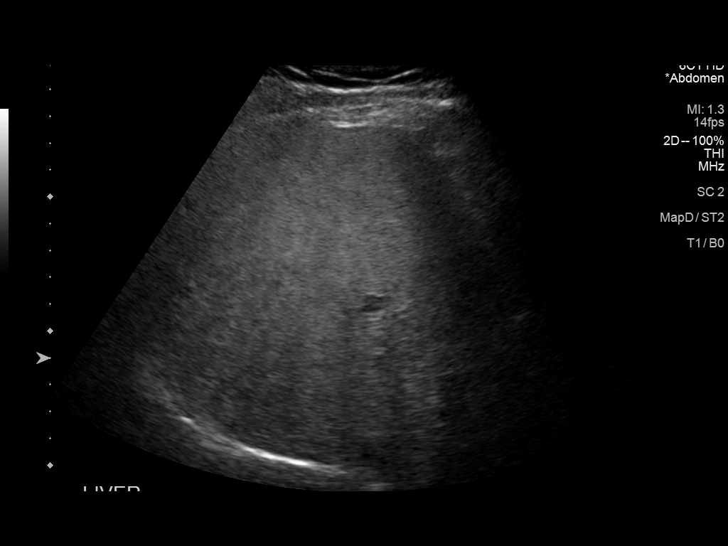
[im 39/47]
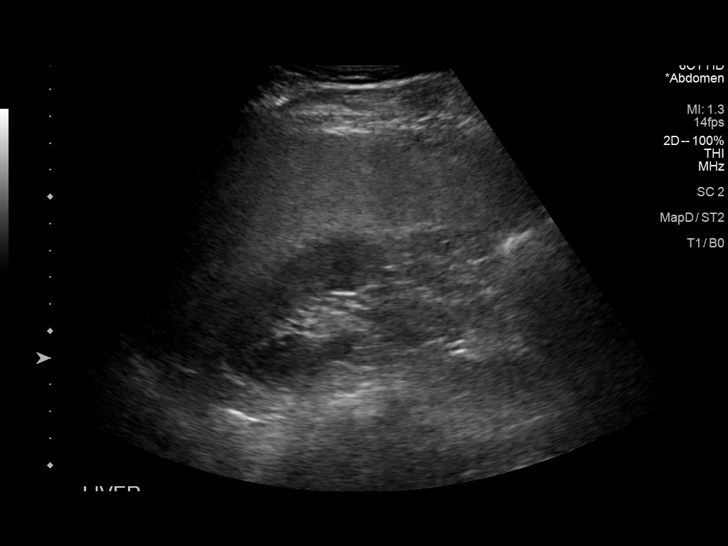
[im 43/47]
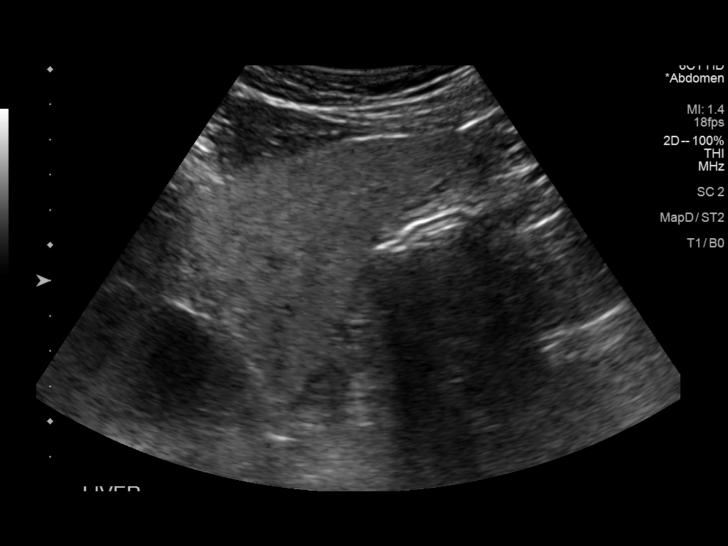
[im 47/47]
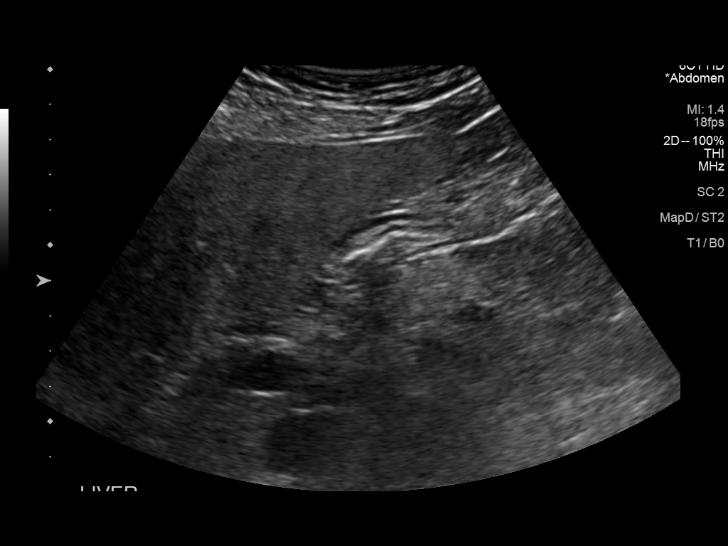

[14 of 25 positions shown; findings below may reference images not displayed]

FINDINGS: Gallbladder:

No gallstones or wall thickening visualized. There is no
pericholecystic fluid. No sonographic Murphy sign noted by
sonographer.

Common bile duct:

Diameter: 3 mm. No intrahepatic or extrahepatic biliary duct
dilatation.

Liver:

No focal lesion identified. Liver echogenicity overall is increased.
Portal vein is patent on color Doppler imaging with normal direction
of blood flow towards the liver.

Other: None.
IMPRESSION: Increase in liver echogenicity, a finding indicative of hepatic
steatosis. No focal liver lesions evident.

Study otherwise unremarkable.

## 2020-04-06 ENCOUNTER — Other Ambulatory Visit: Payer: Self-pay

## 2020-04-06 ENCOUNTER — Ambulatory Visit (INDEPENDENT_AMBULATORY_CARE_PROVIDER_SITE_OTHER): Payer: 59 | Admitting: Dermatology

## 2020-04-06 DIAGNOSIS — D1800 Hemangioma unspecified site: Secondary | ICD-10-CM

## 2020-04-06 DIAGNOSIS — Z1283 Encounter for screening for malignant neoplasm of skin: Secondary | ICD-10-CM | POA: Diagnosis not present

## 2020-04-06 DIAGNOSIS — L570 Actinic keratosis: Secondary | ICD-10-CM | POA: Diagnosis not present

## 2020-04-06 DIAGNOSIS — L814 Other melanin hyperpigmentation: Secondary | ICD-10-CM | POA: Diagnosis not present

## 2020-04-06 DIAGNOSIS — L821 Other seborrheic keratosis: Secondary | ICD-10-CM

## 2020-04-06 DIAGNOSIS — D229 Melanocytic nevi, unspecified: Secondary | ICD-10-CM | POA: Diagnosis not present

## 2020-04-06 DIAGNOSIS — L578 Other skin changes due to chronic exposure to nonionizing radiation: Secondary | ICD-10-CM

## 2020-04-06 NOTE — Progress Notes (Signed)
   Follow-Up Visit   Subjective  Philip Miller is a 54 y.o. male who presents for the following: Actinic Keratosis (6 months f/u hx of Aks ). Pt report no changes he is aware of today.  The patient presents for Total-Body Skin Exam (TBSE) for skin cancer screening and mole check.  The following portions of the chart were reviewed this encounter and updated as appropriate:  Tobacco  Allergies  Meds  Problems  Med Hx  Surg Hx  Fam Hx     Review of Systems:  No other skin or systemic complaints except as noted in HPI or Assessment and Plan.  Objective  Well appearing patient in no apparent distress; mood and affect are within normal limits.  A full examination was performed including scalp, head, eyes, ears, nose, lips, neck, chest, axillae, abdomen, back, buttocks, bilateral upper extremities, bilateral lower extremities, hands, feet, fingers, toes, fingernails, and toenails. All findings within normal limits unless otherwise noted below.  Objective  Left mid distal lateral forearm, face (5): Erythematous thin papules/macules with gritty scale.    Assessment & Plan  AK (actinic keratosis) (5) Left mid distal lateral forearm, face  Destruction of lesion - Left mid distal lateral forearm, face Complexity: simple   Destruction method: cryotherapy   Informed consent: discussed and consent obtained   Timeout:  patient name, date of birth, surgical site, and procedure verified Lesion destroyed using liquid nitrogen: Yes   Region frozen until ice ball extended beyond lesion: Yes   Outcome: patient tolerated procedure well with no complications   Post-procedure details: wound care instructions given     Lentigines - Scattered tan macules - Discussed due to sun exposure - Benign, observe - Call for any changes  Seborrheic Keratoses - Stuck-on, waxy, tan-brown papules and plaques  - Discussed benign etiology and prognosis. - Observe - Call for any changes  Melanocytic  Nevi - Tan-brown and/or pink-flesh-colored symmetric macules and papules - Benign appearing on exam today - Observation - Call clinic for new or changing moles - Recommend daily use of broad spectrum spf 30+ sunscreen to sun-exposed areas.   Hemangiomas - Red papules - Discussed benign nature - Observe - Call for any changes  Actinic Damage - diffuse scaly erythematous macules with underlying dyspigmentation - Recommend daily broad spectrum sunscreen SPF 30+ to sun-exposed areas, reapply every 2 hours as needed.  - Call for new or changing lesions.  Skin cancer screening performed today.  Return in about 6 months (around 10/07/2020) for Aks .  IMarye Round, CMA, am acting as scribe for Sarina Ser, MD .  Documentation: I have reviewed the above documentation for accuracy and completeness, and I agree with the above.  Sarina Ser, MD

## 2020-04-06 NOTE — Patient Instructions (Addendum)

## 2020-04-11 ENCOUNTER — Encounter: Payer: Self-pay | Admitting: Dermatology

## 2020-10-05 ENCOUNTER — Other Ambulatory Visit: Payer: Self-pay

## 2020-10-05 ENCOUNTER — Ambulatory Visit (INDEPENDENT_AMBULATORY_CARE_PROVIDER_SITE_OTHER): Payer: 59 | Admitting: Dermatology

## 2020-10-05 DIAGNOSIS — L578 Other skin changes due to chronic exposure to nonionizing radiation: Secondary | ICD-10-CM | POA: Diagnosis not present

## 2020-10-05 DIAGNOSIS — L570 Actinic keratosis: Secondary | ICD-10-CM

## 2020-10-05 DIAGNOSIS — L821 Other seborrheic keratosis: Secondary | ICD-10-CM | POA: Diagnosis not present

## 2020-10-05 NOTE — Patient Instructions (Signed)
Instructions for [facility]  One or more of your medications was sent to the [facility] mail order compounding pharmacy. You will receive an email from them and can purchase the medicine through that link. It will then be mailed to your home at the address you confirmed. If for any reason you do not receive an email from them, please check your spam folder. If you still do not find the email, please let us know. [facility] phone number is [phone].   

## 2020-10-05 NOTE — Progress Notes (Signed)
   Follow-Up Visit   Subjective  Philip Miller is a 55 y.o. male who presents for the following: Actinic Keratosis (Face, arms, 8m f/u) and dry patch (Scalp, ~2m, pt feels has been txted with LN2 in past).  The following portions of the chart were reviewed this encounter and updated as appropriate:   Tobacco  Allergies  Meds  Problems  Med Hx  Surg Hx  Fam Hx     Review of Systems:  No other skin or systemic complaints except as noted in HPI or Assessment and Plan.  Objective  Well appearing patient in no apparent distress; mood and affect are within normal limits.  A focused examination was performed including face, scalp, arms. Relevant physical exam findings are noted in the Assessment and Plan.  Objective  scalp x 5, face x 2 (7): Pink scaly macules    Assessment & Plan    Seborrheic Keratoses - Stuck-on, waxy, tan-brown papules and plaques  - Discussed benign etiology and prognosis. - Observe - Call for any changes  Actinic Damage - Severe, chronic, secondary to cumulative UV radiation exposure over time - diffuse scaly erythematous macules and papules with underlying dyspigmentation - Discussed Prescription "Field Treatment" for Severe, Chronic Confluent Actinic Changes with Pre-Cancerous Actinic Keratoses Field treatment involves treatment of an entire area of skin that has confluent Actinic Changes (Sun/ Ultraviolet light damage) and PreCancerous Actinic Keratoses by method of PhotoDynamic Therapy (PDT) and/or prescription Topical Chemotherapy agents such as 5-fluorouracil, 5-fluorouracil/calcipotriene, and/or imiquimod.  The purpose is to decrease the number of clinically evident and subclinical PreCancerous lesions to prevent progression to development of skin cancer by chemically destroying early precancer changes that may or may not be visible.  It has been shown to reduce the risk of developing skin cancer in the treated area. As a result of treatment, redness,  scaling, crusting, and open sores may occur during treatment course. One or more than one of these methods may be used and may have to be used several times to control, suppress and eliminate the PreCancerous changes. Discussed treatment course, expected reaction, and possible side effects. - Recommend daily broad spectrum sunscreen SPF 30+ to sun-exposed areas, reapply every 2 hours as needed.  - Call for new or changing lesions.  AK (actinic keratosis) (7) scalp x 5, face x 2  In 1 month  - Start 5-fluorouracil/calcipotriene cream twice a day for 7 days to affected areas including scalp. Prescription sent to [facility]. Patient advised they will receive an email to purchase the medication online and have it sent to their home. Patient provided with handout reviewing treatment course and side effects and advised to call or message [place] on MyChart with any concerns.   Destruction of lesion - scalp x 5, face x 2 Complexity: simple   Destruction method: cryotherapy   Informed consent: discussed and consent obtained   Timeout:  patient name, date of birth, surgical site, and procedure verified Lesion destroyed using liquid nitrogen: Yes   Region frozen until ice ball extended beyond lesion: Yes   Outcome: patient tolerated procedure well with no complications   Post-procedure details: wound care instructions given    Return in about 6 months (around 04/04/2021) for TBSE, AK f/u.   I, Othelia Pulling, RMA, am acting as scribe for Sarina Ser, MD .  Documentation: I have reviewed the above documentation for accuracy and completeness, and I agree with the above.  Sarina Ser, MD

## 2020-10-08 ENCOUNTER — Encounter: Payer: Self-pay | Admitting: Dermatology

## 2020-10-19 ENCOUNTER — Encounter: Payer: Self-pay | Admitting: Cardiovascular Disease

## 2020-10-19 ENCOUNTER — Ambulatory Visit (INDEPENDENT_AMBULATORY_CARE_PROVIDER_SITE_OTHER): Payer: 59 | Admitting: Cardiovascular Disease

## 2020-10-19 ENCOUNTER — Other Ambulatory Visit: Payer: Self-pay

## 2020-10-19 VITALS — BP 140/90 | HR 87 | Ht 72.0 in | Wt 217.2 lb

## 2020-10-19 DIAGNOSIS — K760 Fatty (change of) liver, not elsewhere classified: Secondary | ICD-10-CM | POA: Diagnosis not present

## 2020-10-19 DIAGNOSIS — I10 Essential (primary) hypertension: Secondary | ICD-10-CM | POA: Diagnosis not present

## 2020-10-19 DIAGNOSIS — E782 Mixed hyperlipidemia: Secondary | ICD-10-CM

## 2020-10-19 NOTE — Progress Notes (Unsigned)
Cardiology Office Note:    Date:  10/20/2020   ID:  [name], DOB [birth year], MRN [id number]  PCP:  London Pepper, [facility]  Cardiologist:  Sherren Mocha, MD  Advanced Practice Provider:  No care team member to display Electrophysiologist:  None       Referring MD: London Pepper, MD   Chief Complaint  Patient presents with  . Hypertension    History of Present Illness:    Philip Miller is a 55 y.o. male with a hx of hypertension and mixed hyperlipidemia, and mildly elevated LFTs, presenting for follow-up evaluation.  The patient is doing well.  He continues to swim regularly for exercise.  He has mild dyspnea at times when he first starts swimming but this eases up as he continues on.  He otherwise has no specific limitation.  He can get his heart rate up into the 150s without problems.  Sometimes he sprints in the pool when he is swimming with younger people.  He has no chest pain.  He denies heart palpitations, edema, orthopnea, or PND.  Past Medical History:  Diagnosis Date  . Allergic rhinitis   . Dyslipidemia   . Graves disease   . Hypothyroidism   . S/P radioactive iodine thyroid ablation   . Ulnar nerve injury    left side  . Vitamin D deficiency     Past Surgical History:  Procedure Laterality Date  . colonscopy  1996  . HAND SURGERY Right 1990   place pin in hand  . LACERATION REPAIR Left 11/10/2008  . WISDOM TOOTH EXTRACTION  1986    Current Medications: Current Meds  Medication Sig  . aspirin EC 81 MG tablet Take 1 tablet (81 mg total) by mouth daily.  Marland Kitchen atorvastatin (LIPITOR) 20 MG tablet Take 20 mg by mouth daily.  . Cholecalciferol (VITAMIN D3) 5000 units CAPS Take 5,000 Units by mouth daily.  . fluticasone (VERAMYST) 27.5 MCG/SPRAY nasal spray Place 2 sprays into the nose daily.  Marland Kitchen levothyroxine (SYNTHROID) 112 MCG tablet Take 2 tablets 6 days a week and just 1.5 tablets 1 day a week; in AM on empty stomach  for thyroid (name brand only DAW)  . loratadine (CLARITIN) 10 MG tablet Take 10 mg by mouth daily.  . Multiple Vitamin (MULTIVITAMIN) capsule Take 1 capsule by mouth daily.  . valACYclovir (VALTREX) 500 MG tablet 1,000 mg 2 (two) times daily as needed (fever blister outbreaks). TAKE 2 TABLETS BY MOUTH DAILY EVERY 12 HRS AS NEEDED  . valsartan (DIOVAN) 160 MG tablet Take 160 mg by mouth daily.     Allergies:   Patient has no known allergies.   Social History   Socioeconomic History  . Marital status: Married    Spouse name: Not on file  . Number of children: Not on file  . Years of education: Not on file  . Highest education level: Not on file  Occupational History  . Not on file  Tobacco Use  . Smoking status: Never Smoker  . Smokeless tobacco: Never Used  Substance and Sexual Activity  . Alcohol use: Yes    Comment: OCCASIONALLY  . Drug use: No  . Sexual activity: Yes  Other Topics Concern  . Not on file  Social History Narrative  . Not on file   Social Determinants of Health   Financial Resource Strain: Not on file  Food Insecurity: Not on file  Transportation Needs: Not on file  Physical Activity:  Not on file  Stress: Not on file  Social Connections: Not on file     Family History: The patient's family history includes Diabetes type I in his cousin; Healthy in his sister; Heart attack in his maternal grandfather; Heart disease in his mother; Hyperthyroidism in his paternal grandmother; Hypothyroidism in his maternal aunt; Lymphoma in his maternal grandmother.  ROS:   Please see the history of present illness.    All other systems reviewed and are negative.  EKGs/Labs/Other Studies Reviewed:    The following studies were reviewed today: CTA 05/25/2016: FINDINGS: Non-cardiac: Distal esophageal thickening See separate report from [facility].  Ascending Aorta:  3.4 cm  Pericardium: Normal  Coronary arteries:  Mild calcium seen in proximal to mid  RCA  IMPRESSION: Coronary calcium score of 7. This was 52nd percentile for age and sex matched control.  EKG:  EKG is ordered today.  The ekg ordered today demonstrates normal sinus rhythm 87 bpm.  Recent Labs: No results found for requested labs within last 8760 hours.  Recent Lipid Panel No results found for: CHOL, TRIG, HDL, CHOLHDL, VLDL, LDLCALC, LDLDIRECT   Risk Assessment/Calculations:       Physical Exam:    VS:  BP 140/90   Pulse 87   Ht 6' (1.829 m)   Wt 217 lb 3.2 oz (98.5 kg)   SpO2 97%   BMI 29.46 kg/m     Wt Readings from Last 3 Encounters:  10/19/20 217 lb 3.2 oz (98.5 kg)  10/08/19 214 lb 12.8 oz (97.4 kg)  04/25/17 222 lb 12.8 oz (101.1 kg)     GEN:  Well nourished, well developed in no acute distress HEENT: Normal NECK: No JVD; No carotid bruits LYMPHATICS: No lymphadenopathy CARDIAC: RRR, no murmurs, rubs, gallops RESPIRATORY:  Clear to auscultation without rales, wheezing or rhonchi  ABDOMEN: Soft, non-tender, non-distended MUSCULOSKELETAL:  No edema; No deformity  SKIN: Warm and dry NEUROLOGIC:  Alert and oriented x 3 PSYCHIATRIC:  Normal affect   ASSESSMENT:    1. Essential hypertension   2. Mixed hyperlipidemia   3. Steatosis of liver    PLAN:    In order of problems listed above:  1. Blood pressure high normal.  I reviewed his last office visit with Dr. Elyse Hsu.  His home blood pressures were in good range at that time.  He will continue valsartan 160 mg daily.  I emphasized diet and exercise with a focus on weight loss.  See below. 2. Lipids reviewed.  He continues on atorvastatin 20 mg daily.  Cholesterol 166, direct LDL 85, HDL 57, triglycerides 185. 3. We had a lengthy discussion regarding weight loss and diet today.  I suspect 10 to 15 pounds of weight loss would bring his blood pressure into normal range, further improve his lipids, and help long-term with hepatic steatosis.  He will work on this.  I will plan to see him back  in 1 year for follow-up evaluation.  No medication changes are made today.  He understands to call if there is any change in his symptoms that occur with swimming.  If he develops any chest pain or progressive exertional dyspnea, he will contact us.  Medication Adjustments/Labs and Tests Ordered: Current medicines are reviewed at length with the patient today.  Concerns regarding medicines are outlined above.  Orders Placed This Encounter  Procedures  . EKG 12-Lead   No orders of the defined types were placed in this encounter.   Patient Instructions  Medication  Instructions:  Your provider recommends that you continue on your current medications as directed. Please refer to the Current Medication list given to you today.   *If you need a refill on your cardiac medications before your next appointment, please call your pharmacy*   Follow-Up: At [facility], you and your health needs are our priority.  As part of our continuing mission to provide you with exceptional heart care, we have created designated Provider Care Teams.  These Care Teams include your primary Cardiologist (physician) and Advanced Practice Providers (APPs -  Physician Assistants and Nurse Practitioners) who all work together to provide you with the care you need, when you need it. Your next appointment:   12 month(s) The format for your next appointment:   In Person Provider:   You may see Sherren Mocha, MD or one of the following Advanced Practice Providers on your designated Care Team:    Richardson Dopp, PA-C  Robbie Lis, [place]      Signed, Sherren Mocha, MD  10/20/2020 5:25 AM    [place]

## 2020-10-19 NOTE — Patient Instructions (Signed)

## 2021-05-15 ENCOUNTER — Encounter: Payer: Self-pay | Admitting: Dermatology

## 2021-05-15 ENCOUNTER — Other Ambulatory Visit: Payer: Self-pay

## 2021-05-15 ENCOUNTER — Ambulatory Visit (INDEPENDENT_AMBULATORY_CARE_PROVIDER_SITE_OTHER): Payer: 59 | Admitting: Dermatology

## 2021-05-15 DIAGNOSIS — L578 Other skin changes due to chronic exposure to nonionizing radiation: Secondary | ICD-10-CM

## 2021-05-15 DIAGNOSIS — Z1283 Encounter for screening for malignant neoplasm of skin: Secondary | ICD-10-CM

## 2021-05-15 DIAGNOSIS — L570 Actinic keratosis: Secondary | ICD-10-CM

## 2021-05-15 DIAGNOSIS — Z872 Personal history of diseases of the skin and subcutaneous tissue: Secondary | ICD-10-CM | POA: Diagnosis not present

## 2021-05-15 DIAGNOSIS — D1800 Hemangioma unspecified site: Secondary | ICD-10-CM

## 2021-05-15 DIAGNOSIS — L821 Other seborrheic keratosis: Secondary | ICD-10-CM

## 2021-05-15 DIAGNOSIS — D229 Melanocytic nevi, unspecified: Secondary | ICD-10-CM

## 2021-05-15 DIAGNOSIS — L814 Other melanin hyperpigmentation: Secondary | ICD-10-CM

## 2021-05-15 DIAGNOSIS — L817 Pigmented purpuric dermatosis: Secondary | ICD-10-CM | POA: Diagnosis not present

## 2021-05-15 DIAGNOSIS — I872 Venous insufficiency (chronic) (peripheral): Secondary | ICD-10-CM | POA: Diagnosis not present

## 2021-05-15 DIAGNOSIS — L820 Inflamed seborrheic keratosis: Secondary | ICD-10-CM | POA: Diagnosis not present

## 2021-05-15 NOTE — Progress Notes (Signed)
Follow-Up Visit   Subjective  Philip Miller is a 55 y.o. male who presents for the following: Annual Exam (Patient here for full body skin exam and skin cancer screening. Patient with no hx of skin cancer but does have a hx of AK's. Patient not aware of any new or changing spots. Patient did use 5FU/calcipotriene at the scalp earlier this year and did have some peeling. ).  The following portions of the chart were reviewed this encounter and updated as appropriate:   Tobacco  Allergies  Meds  Problems  Med Hx  Surg Hx  Fam Hx     Review of Systems:  No other skin or systemic complaints except as noted in HPI or Assessment and Plan.  Objective  Well appearing patient in no apparent distress; mood and affect are within normal limits.  A full examination was performed including scalp, head, eyes, ears, nose, lips, neck, chest, axillae, abdomen, back, buttocks, bilateral upper extremities, bilateral lower extremities, hands, feet, fingers, toes, fingernails, and toenails. All findings within normal limits unless otherwise noted below.  right cheek x 2 (2) Erythematous keratotic or waxy stuck-on papule or plaque.   left nasal bridge x 1, left cheek x 2, left ear x 1 (4) Erythematous thin papules/macules with gritty scale.   lower legs Erythematous, scaly patches involving the ankle and distal lower leg with associated lower leg edema.    Assessment & Plan  Inflamed seborrheic keratosis right cheek x 2  Destruction of lesion - right cheek x 2 Complexity: simple   Destruction method: cryotherapy   Informed consent: discussed and consent obtained   Timeout:  patient name, date of birth, surgical site, and procedure verified Lesion destroyed using liquid nitrogen: Yes   Region frozen until ice ball extended beyond lesion: Yes   Outcome: patient tolerated procedure well with no complications   Post-procedure details: wound care instructions given    AK (actinic keratosis)  (4) left nasal bridge x 1, left cheek x 2, left ear x 1  Destruction of lesion - left nasal bridge x 1, left cheek x 2, left ear x 1 Complexity: simple   Destruction method: cryotherapy   Informed consent: discussed and consent obtained   Timeout:  patient name, date of birth, surgical site, and procedure verified Lesion destroyed using liquid nitrogen: Yes   Region frozen until ice ball extended beyond lesion: Yes   Outcome: patient tolerated procedure well with no complications   Post-procedure details: wound care instructions given    Venous stasis dermatitis of right lower extremity lower legs  With Schamberg's purpura  Stasis in the legs causes chronic leg swelling, which may result in itchy or painful rashes, skin discoloration, skin texture changes, and sometimes ulceration.  Recommend daily compression hose/stockings- easiest to put on first thing in morning, remove at bedtime.  Elevate legs as much as possible. Avoid salt/sodium rich foods.   Skin cancer screening  Lentigines - Scattered tan macules - Due to sun exposure - Benign-appearing, observe - Recommend daily broad spectrum sunscreen SPF 30+ to sun-exposed areas, reapply every 2 hours as needed. - Call for any changes  Seborrheic Keratoses - Stuck-on, waxy, tan-brown papules and/or plaques  - Benign-appearing - Discussed benign etiology and prognosis. - Observe - Call for any changes  Melanocytic Nevi - Tan-brown and/or pink-flesh-colored symmetric macules and papules - Benign appearing on exam today - Observation - Call clinic for new or changing moles - Recommend daily use of broad spectrum spf  30+ sunscreen to sun-exposed areas.   Hemangiomas - Red papules - Discussed benign nature - Observe - Call for any changes  Actinic Damage - Severe, confluent actinic changes with pre-cancerous actinic keratoses  - Severe, chronic, not at goal, secondary to cumulative UV radiation exposure over time -  diffuse scaly erythematous macules and papules with underlying dyspigmentation - Discussed Prescription "Field Treatment" for Severe, Chronic Confluent Actinic Changes with Pre-Cancerous Actinic Keratoses Field treatment involves treatment of an entire area of skin that has confluent Actinic Changes (Sun/ Ultraviolet light damage) and PreCancerous Actinic Keratoses by method of PhotoDynamic Therapy (PDT) and/or prescription Topical Chemotherapy agents such as 5-fluorouracil, 5-fluorouracil/calcipotriene, and/or imiquimod.  The purpose is to decrease the number of clinically evident and subclinical PreCancerous lesions to prevent progression to development of skin cancer by chemically destroying early precancer changes that may or may not be visible.  It has been shown to reduce the risk of developing skin cancer in the treated area. As a result of treatment, redness, scaling, crusting, and open sores may occur during treatment course. One or more than one of these methods may be used and may have to be used several times to control, suppress and eliminate the PreCancerous changes. Discussed treatment course, expected reaction, and possible side effects. - Recommend daily broad spectrum sunscreen SPF 30+ to sun-exposed areas, reapply every 2 hours as needed.  - Staying in the shade or wearing long sleeves, sun glasses (UVA+UVB protection) and wide brim hats (4-inch brim around the entire circumference of the hat) are also recommended. - Call for new or changing lesions.  - Start 5-fluorouracil/calcipotriene cream twice a day for 7 days to affected areas including scalp. Prescription sent to [facility]. Patient advised they will receive an email to purchase the medication online and have it sent to their home. Patient provided with handout reviewing treatment course and side effects and advised to call or message [place] on MyChart with any concerns.  Skin cancer screening performed  today.  History of PreCancerous Actinic Keratosis  - site(s) of PreCancerous Actinic Keratosis clear today. - these may recur and new lesions may form requiring treatment to prevent transformation into skin cancer - observe for new or changing spots and contact [place] for appointment if occur - photoprotection with sun protective clothing; sunglasses and broad spectrum sunscreen with SPF of at least 30 + and frequent self skin exams recommended - yearly exams by a dermatologist recommended for persons with history of PreCancerous Actinic Keratoses  Return in about 6 months (around 11/12/2021) for AK follow up, sun exposed areas.  Graciella Belton, RMA, am acting as scribe for Sarina Ser, MD . Documentation: I have reviewed the above documentation for accuracy and completeness, and I agree with the above.  Sarina Ser, MD

## 2021-05-15 NOTE — Patient Instructions (Addendum)
- Start 5-fluorouracil/calcipotriene cream twice a day for 7 days to affected areas including scalp. Prescription sent to [facility]. Patient advised they will receive an email to purchase the medication online and have it sent to their home. Patient provided with handout reviewing treatment course and side effects and advised to call or message [place] on MyChart with any concerns.  Instructions for [facility] Medications  One or more of your medications was sent to the [facility] mail order compound[facility] pharmacy. You will receive an email from them and can purchase the medicine through that link. It will then be mailed to your home at the address you confirmed. If for any reason you do not receive an email from them, please check your spam folder. If you still do not find the email, please let us know. [facility] phone number is [phone].   Cryotherapy Aftercare  Wash gently with soap and water everyday.   Apply Vaseline and Band-Aid daily until healed.    Melanoma ABCDEs  Melanoma is the most dangerous type of skin cancer, and is the leading cause of death from skin disease.  You are more likely to develop melanoma if you: Have light-colored skin, light-colored eyes, or red or blond hair Spend a lot of time in the sun Tan regularly, either outdoors or in a tanning bed Have had blistering sunburns, especially during childhood Have a close family member who has had a melanoma Have atypical moles or large birthmarks  Early detection of melanoma is key since treatment is typically straightforward and cure rates are extremely high if we catch it early.   The first sign of melanoma is often a change in a mole or a new dark spot.  The ABCDE system is a way of remembering the signs of melanoma.  A for asymmetry:  The two halves do not match. B for border:  The edges of the growth are irregular. C for color:  A mixture of colors are present instead of an  even brown color. D for diameter:  Melanomas are usually (but not always) greater than 69mm - the size of a pencil eraser. E for evolution:  The spot keeps changing in size, shape, and color.  Please check your skin once per month between visits. You can use a small mirror in front and a large mirror behind you to keep an eye on the back side or your body.   If you see any new or changing lesions before your next follow-up, please call to schedule a visit.  Please continue daily skin protection including broad spectrum sunscreen SPF 30+ to sun-exposed areas, reapplying every 2 hours as needed when you're outdoors.    If you have any questions or concerns for your doctor, please call our main line at [phone] and press option 4 to reach your doctor's medical assistant. If no one answers, please leave a voicemail as directed and we will return your call as soon as possible. Messages left after 4 pm will be answered the following business day.   You may also send [place] a message via [place]. We typically respond to MyChart messages within 1-2 business days.  For prescription refills, please ask your pharmacy to contact our office. Our fax number is [phone].  If you have an urgent issue when the clinic is closed that cannot wait until the next business day, you can page your doctor at the number below.    Please note that while we do our  best to be available for urgent issues outside of office hours, we are not available 24/7.   If you have an urgent issue and are unable to reach [place], you may choose to seek medical care at your doctor's office, retail clinic, urgent care center, or emergency room.  If you have a medical emergency, please immediately call 911 or go to the emergency department.  Pager Numbers  - Dr. Nehemiah Massed: [phone]  - Dr. Laurence Ferrari: [phone]  - Dr. Nicole Kindred: [phone]  In the event of inclement weather, please call our main line at [phone] for an update on  the status of any delays or closures.  Dermatology Medication Tips: Please keep the boxes that topical medications come in in order to help keep track of the instructions about where and how to use these. Pharmacies typically print the medication instructions only on the boxes and not directly on the medication tubes.   If your medication is too expensive, please contact our office at [phone] option 4 or send [place] a message through [place].   We are unable to tell what your co-pay for medications will be in advance as this is different depending on your insurance coverage. However, we may be able to find a substitute medication at lower cost or fill out paperwork to get insurance to cover a needed medication.   If a prior authorization is required to get your medication covered by your insurance company, please allow [place] 1-2 business days to complete this process.  Drug prices often vary depending on where the prescription is filled and some pharmacies may offer cheaper prices.  The website www.goodrx.com contains coupons for medications through different pharmacies. The prices here do not account for what the cost may be with help from insurance (it may be cheaper with your insurance), but the website can give you the price if you did not use any insurance.  - You can print the associated coupon and take it with your prescription to the pharmacy.  - You may also stop by our office during regular business hours and pick up a GoodRx coupon card.  - If you need your prescription sent electronically to a different pharmacy, notify our office through [facility] or by phone at [phone] option 4.

## 2021-10-26 ENCOUNTER — Other Ambulatory Visit: Payer: Self-pay

## 2021-10-26 ENCOUNTER — Ambulatory Visit: Payer: 59 | Admitting: Cardiovascular Disease

## 2021-10-26 ENCOUNTER — Encounter: Payer: Self-pay | Admitting: Cardiovascular Disease

## 2021-10-26 VITALS — BP 152/78 | HR 78 | Ht 72.0 in | Wt 225.0 lb

## 2021-10-26 DIAGNOSIS — I10 Essential (primary) hypertension: Secondary | ICD-10-CM

## 2021-10-26 DIAGNOSIS — E782 Mixed hyperlipidemia: Secondary | ICD-10-CM | POA: Diagnosis not present

## 2021-10-26 MED ORDER — AMLODIPINE BESYLATE 5 MG PO TABS: 5.0000 mg | ORAL_TABLET | Freq: Every day | ORAL | 3 refills | Status: DC

## 2021-10-26 NOTE — Patient Instructions (Signed)
Medication Instructions:  ?START Norvasc (Amlodipine) 5mg  QD ?*If you need a refill on your cardiac medications before your next appointment, please call your pharmacy* ? ? ?Lab Work: ?NONE ?If you have labs (blood work) drawn today and your tests are completely normal, you will receive your results only by: ?MyChart Message (if you have MyChart) OR ?A paper copy in the mail ?If you have any lab test that is abnormal or we need to change your treatment, we will call you to review the results. ? ? ?Testing/Procedures: ?NONE ? ? ?Follow-Up: ?At [facility], you and your health needs are our priority.  As part of our continuing mission to provide you with exceptional heart care, we have created designated Provider Care Teams.  These Care Teams include your primary Cardiologist (physician) and Advanced Practice Providers (APPs -  Physician Assistants and Nurse Practitioners) who all work together to provide you with the care you need, when you need it.  ? ?Your next appointment:   ?1 year(s) ? ?The format for your next appointment:   ?In Person ? ?Provider:   ?Sherren Mocha, MD   ? ?  ?

## 2021-10-26 NOTE — Progress Notes (Signed)
?Cardiology Office Note:   ? ?Date:  10/26/2021  ? ?ID:  [name], DOB 1965-10-08, MRN [id number] ? ?PCP:  London Pepper, MD ?  ?[facility] Providers ?Cardiologist:  Sherren Mocha, MD    ? ?Referring MD: London Pepper, MD  ? ?Chief Complaint  ?Patient presents with  ? Follow-up  ?  Hypertension  ? ? ?History of Present Illness:   ? ?Philip Miller is a 56 y.o. male with a hx of hypertension and mixed hyperlipidemia, and mildly elevated LFTs, presenting for follow-up evaluation. ? ?The patient is here alone today.  He was doing well from a cardiac standpoint.  He denies chest pain, chest pressure, shortness of breath, heart palpitations, orthopnea, or PND.  He has had no leg swelling.  He has not been quite as active as in the past because swimming has been his primary form of exercise and they have been doing renovations at the pool where he goes. ? ?Past Medical History:  ?Diagnosis Date  ? Allergic rhinitis   ? Dyslipidemia   ? Graves disease   ? Hypothyroidism   ? S/P radioactive iodine thyroid ablation   ? 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 Vitamin D deficiency   ? ? ?Past Surgical History:  ?Procedure Laterality Date  ? colonscopy  1996  ? HAND SURGERY Right 1990  ? place pin in hand  ? LACERATION REPAIR Left 11/10/2008  ? [place] EXTRACTION  1986  ? ? ?Current Medications: ?Current Meds  ?Medication Sig  ? amLODipine (NORVASC) 5 MG tablet Take 1 tablet (5 mg total) by mouth daily.  ? aspirin EC 81 MG tablet Take 1 tablet (81 mg total) by mouth daily.  ? atorvastatin (LIPITOR) 20 MG tablet Take 20 mg by mouth daily.  ? Cholecalciferol (VITAMIN D3) 5000 units CAPS Take 5,000 Units by mouth daily.  ? fluticasone (VERAMYST) 27.5 MCG/SPRAY nasal spray Place 2 sprays into the nose daily.  ? levothyroxine (SYNTHROID) 125 MCG tablet 2 tablets 6 days a week; 1 1/2 tablets 1 day/week.  ? loratadine (CLARITIN) 10 MG tablet Take 10 mg by mouth daily.  ? Multiple Vitamin (MULTIVITAMIN) capsule Take 1  capsule by mouth daily.  ? valACYclovir (VALTREX) 500 MG tablet 1,000 mg 2 (two) times daily as needed (fever blister outbreaks). TAKE 2 TABLETS BY MOUTH DAILY EVERY 12 HRS AS NEEDED  ? valsartan (DIOVAN) 160 MG tablet Take 160 mg by mouth daily.  ?  ? ?Allergies:   Patient has no known allergies.  ? ?Social History  ? ?Socioeconomic History  ? Marital status: Married  ?  Spouse name: Not on file  ? Number of children: Not on file  ? Years of education: Not on file  ? Highest education level: Not on file  ?Occupational History  ? Not on file  ?Tobacco Use  ? Smoking status: Never  ? Smokeless tobacco: Never  ?Substance and Sexual Activity  ? Alcohol use: Yes  ?  Comment: OCCASIONALLY  ? Drug use: No  ? Sexual activity: Yes  ?Other Topics Concern  ? Not on file  ?Social History Narrative  ? Not on file  ? ?Social Determinants of Health  ? ?Financial Resource Strain: Not on file  ?Food Insecurity: Not on file  ?Transportation Needs: Not on file  ?Physical Activity: Not on file  ?Stress: Not on file  ?Social Connections: Not on file  ?  ? ?Family History: ?The patient's family history includes Diabetes type I  in his cousin; Healthy in his sister; Heart attack in his maternal grandfather; Heart disease in his mother; Hyperthyroidism in his paternal grandmother; Hypothyroidism in his maternal aunt; Lymphoma in his maternal grandmother. ? ?ROS:   ?Please see the history of present illness.    ?All other systems reviewed and are negative. ? ?EKGs/Labs/Other Studies Reviewed:   ? ?Coronary calcium score CT 05/04/2016: ?IMPRESSION: ?Coronary calcium score of 7. This was 52nd percentile for age and ?sex matched control. ? ?EKG:  EKG is ordered today.  The ekg ordered today demonstrates normal sinus rhythm 78 bpm, within normal limits. ? ?Recent Labs: ?No results found for requested labs within last 8760 hours.  ?Recent Lipid Panel ?No results found for: CHOL, TRIG, HDL, CHOLHDL, VLDL, LDLCALC, LDLDIRECT ? ? ?Risk  Assessment/Calculations:   ?  ? ?    ? ?Physical Exam:   ? ?VS:  BP (!) 152/78   Pulse 78   Ht 6' (1.829 m)   Wt 225 lb (102.1 kg)   SpO2 97%   BMI 30.52 kg/m?    ? ?Wt Readings from Last 3 Encounters:  ?10/26/21 225 lb (102.1 kg)  ?10/19/20 217 lb 3.2 oz (98.5 kg)  ?10/08/19 214 lb 12.8 oz (97.4 kg)  ?  ? ?GEN:  Well nourished, well developed in no acute distress ?HEENT: Normal ?NECK: No JVD; No carotid bruits ?LYMPHATICS: No lymphadenopathy ?CARDIAC: RRR, no murmurs, rubs, gallops ?RESPIRATORY:  Clear to auscultation without rales, wheezing or rhonchi  ?ABDOMEN: Soft, non-tender, non-distended ?MUSCULOSKELETAL:  No edema; No deformity  ?SKIN: Warm and dry ?NEUROLOGIC:  Alert and oriented x 3 ?PSYCHIATRIC:  Normal affect  ? ?ASSESSMENT:   ? ?1. Essential hypertension   ?2. Mixed hyperlipidemia   ? ?PLAN:   ? ?In order of problems listed above: ? ?Blood pressure control suboptimal on valsartan.  Reviewed treatment options.  Discussed lifestyle modification, exercise, and weight loss measures.  Advised to add amlodipine 5 mg daily.  Discussed potential side effects. Otherwise continue current management. ?Cholesterol is 183, HDL 53, LDL 101.Marland Kitchen  Patient is on atorvastatin.  Discussed lifestyle modification.  He will work on his diet and exercise program.  His coronary calcium score when done in 2017 showed a total score of 7 which placed him at the 52nd percentile for age and sex matched control. ? ?   ? ?   ? ? ?Medication Adjustments/Labs and Tests Ordered: ?Current medicines are reviewed at length with the patient today.  Concerns regarding medicines are outlined above.  ?Orders Placed This Encounter  ?Procedures  ? EKG 12-Lead  ? ?Meds ordered this encounter  ?Medications  ? amLODipine (NORVASC) 5 MG tablet  ?  Sig: Take 1 tablet (5 mg total) by mouth daily.  ?  Dispense:  90 tablet  ?  Refill:  3  ? ? ?Patient Instructions  ?Medication Instructions:  ?START Norvasc (Amlodipine) 5mg  QD ?*If you need a refill  on your cardiac medications before your next appointment, please call your pharmacy* ? ? ?Lab Work: ?NONE ?If you have labs (blood work) drawn today and your tests are completely normal, you will receive your results only by: ?MyChart Message (if you have MyChart) OR ?A paper copy in the mail ?If you have any lab test that is abnormal or we need to change your treatment, we will call you to review the results. ? ? ?Testing/Procedures: ?NONE ? ? ?Follow-Up: ?At [facility], you and your health needs are our priority.  As part  of our continuing mission to provide you with exceptional heart care, we have created designated Provider Care Teams.  These Care Teams include your primary Cardiologist (physician) and Advanced Practice Providers (APPs -  Physician Assistants and Nurse Practitioners) who all work together to provide you with the care you need, when you need it.  ? ?Your next appointment:   ?1 year(s) ? ?The format for your next appointment:   ?In Person ? ?Provider:   ?Sherren Mocha, MD   ? ?   ? ?Signed, ?Sherren Mocha, MD  ?10/26/2021 7:44 PM    ?[place] ?

## 2021-11-22 ENCOUNTER — Ambulatory Visit: Payer: 59 | Admitting: Dermatology

## 2021-11-22 ENCOUNTER — Other Ambulatory Visit: Payer: Self-pay

## 2021-11-22 DIAGNOSIS — L578 Other skin changes due to chronic exposure to nonionizing radiation: Secondary | ICD-10-CM

## 2021-11-22 DIAGNOSIS — L820 Inflamed seborrheic keratosis: Secondary | ICD-10-CM | POA: Diagnosis not present

## 2021-11-22 DIAGNOSIS — L570 Actinic keratosis: Secondary | ICD-10-CM | POA: Diagnosis not present

## 2021-11-22 NOTE — Patient Instructions (Addendum)

## 2021-11-22 NOTE — Progress Notes (Signed)
? ?Follow-Up Visit ?  ?Subjective  ?Philip Miller is a 56 y.o. male who presents for the following: Actinic Keratosis (6 months f/u sun exposed areas, pt treated his scalp with 5FU/Calcipotriene cream 6 months ago with a good response). ?The patient has spots, moles and lesions to be evaluated, some may be new or changing and the patient has concerns that these could be cancer. ? ?The following portions of the chart were reviewed this encounter and updated as appropriate:  ? Tobacco  Allergies  Meds  Problems  Med Hx  Surg Hx  Fam Hx   ?  ?Review of Systems:  No other skin or systemic complaints except as noted in HPI or Assessment and Plan. ? ?Objective  ?Well appearing patient in no apparent distress; mood and affect are within normal limits. ? ?A focused examination was performed including face. Relevant physical exam findings are noted in the Assessment and Plan. ? ?left medial lower leg x 3 (3) ?Stuck-on, waxy, tan-brown papules and plaques -- Discussed benign etiology and prognosis.  ? ? ? ? ? ? ? ?Assessment & Plan  ?Inflamed seborrheic keratosis (3) ?left medial lower leg x 3 ? ?Reassured benign age-related growth.  Recommend observation.  Discussed cryotherapy if spot(s) become irritated or inflamed.  ? ?Destruction of lesion - left medial lower leg x 3 ?Complexity: simple   ?Destruction method: cryotherapy   ?Informed consent: discussed and consent obtained   ?Timeout:  patient name, date of birth, surgical site, and procedure verified ?Lesion destroyed using liquid nitrogen: Yes   ?Region frozen until ice ball extended beyond lesion: Yes   ?Outcome: patient tolerated procedure well with no complications   ?Post-procedure details: wound care instructions given   ? ?Actinic Damage - Severe, confluent actinic changes with pre-cancerous actinic keratoses  ?- Severe, chronic, not at goal, secondary to cumulative UV radiation exposure over time ?- diffuse scaly erythematous macules and papules with  underlying dyspigmentation ?- Discussed Prescription "Field Treatment" for Severe, Chronic Confluent Actinic Changes with Pre-Cancerous Actinic Keratoses ? ?Start 5FU/calcipotriene cream spot to treat to areas on the face/forehead twice a day x 7 days  ?Field treatment involves treatment of an entire area of skin that has confluent Actinic Changes (Sun/ Ultraviolet light damage) and PreCancerous Actinic Keratoses by method of PhotoDynamic Therapy (PDT) and/or prescription Topical Chemotherapy agents such as 5-fluorouracil, 5-fluorouracil/calcipotriene, and/or imiquimod.  The purpose is to decrease the number of clinically evident and subclinical PreCancerous lesions to prevent progression to development of skin cancer by chemically destroying early precancer changes that may or may not be visible.  It has been shown to reduce the risk of developing skin cancer in the treated area. As a result of treatment, redness, scaling, crusting, and open sores may occur during treatment course. One or more than one of these methods may be used and may have to be used several times to control, suppress and eliminate the PreCancerous changes. Discussed treatment course, expected reaction, and possible side effects. ?- Recommend daily broad spectrum sunscreen SPF 30+ to sun-exposed areas, reapply every 2 hours as needed.  ?- Staying in the shade or wearing long sleeves, sun glasses (UVA+UVB protection) and wide brim hats (4-inch brim around the entire circumference of the hat) are also recommended. ?- Call for new or changing lesions.  ? ?Return in about 6 months (around 05/25/2022) for Aks . ? ?I, Marye Round, CMA, am acting as scribe for Sarina Ser, MD . Documentation: I have reviewed the above documentation  for accuracy and completeness, and I agree with the above. ? ?Sarina Ser, MD ? ?

## 2021-11-23 ENCOUNTER — Encounter: Payer: Self-pay | Admitting: Dermatology

## 2022-01-08 ENCOUNTER — Encounter: Payer: Self-pay | Admitting: Cardiovascular Disease

## 2022-01-08 NOTE — Telephone Encounter (Signed)
Called and spoke to wife Lattie Haw (on [place]) who states pt's legs have increasingly become swollen from calves down to feet that has begun over the last week. Pt is still able to wear shoes and does not wear compression socks. Denies increase in activity or sitting. Condones upon awakening the swelling is decreased, but present again by lunch time. Pt on phone at this time via speaker and denies checking his BP at home regularly, but states his last reading was 138/80's at last check (last month) and is still using Valsartan '160mg'$  daily. Pt states that when on his feet all day, they really ache. Will route to [place] for advisement. ?

## 2022-01-09 MED ORDER — HYDROCHLOROTHIAZIDE 25 MG PO TABS: 25.0000 mg | ORAL_TABLET | Freq: Every day | ORAL | 3 refills | Status: DC

## 2022-01-09 NOTE — Addendum Note (Signed)
Addended by: Ma Hillock on: 01/09/2022 05:03 PM ? ? Modules accepted: Orders ? ?

## 2022-01-09 NOTE — Telephone Encounter (Signed)
STOP amlodipine ?START HCTZ 25 mg daily ?CONTINUE Valsartan at current dose ?

## 2022-05-28 ENCOUNTER — Encounter: Payer: Self-pay | Admitting: Dermatology

## 2022-05-28 ENCOUNTER — Ambulatory Visit: Payer: 59 | Admitting: Dermatology

## 2022-05-28 DIAGNOSIS — L578 Other skin changes due to chronic exposure to nonionizing radiation: Secondary | ICD-10-CM | POA: Diagnosis not present

## 2022-05-28 DIAGNOSIS — L570 Actinic keratosis: Secondary | ICD-10-CM

## 2022-05-28 DIAGNOSIS — L821 Other seborrheic keratosis: Secondary | ICD-10-CM

## 2022-05-28 DIAGNOSIS — Z5111 Encounter for antineoplastic chemotherapy: Secondary | ICD-10-CM

## 2022-05-28 DIAGNOSIS — Z79899 Other long term (current) drug therapy: Secondary | ICD-10-CM | POA: Diagnosis not present

## 2022-05-28 NOTE — Progress Notes (Signed)
Follow-Up Visit   Subjective  Philip Miller is a 56 y.o. male who presents for the following: Actinic Keratosis (6 month recheck. Face. Hx of 5FU/Calcipotriene treatment on scalp and upper forehead since last visit). The patient has spots, moles and lesions to be evaluated, some may be new or changing and the patient has concerns that these could be cancer.  The following portions of the chart were reviewed this encounter and updated as appropriate:  Tobacco  Allergies  Meds  Problems  Med Hx  Surg Hx  Fam Hx     Review of Systems: No other skin or systemic complaints except as noted in HPI or Assessment and Plan.  Objective  Well appearing patient in no apparent distress; mood and affect are within normal limits.  A focused examination was performed including scalp, face, neck, ears, hands, arms. Relevant physical exam findings are noted in the Assessment and Plan.  Scalp x12, face x3 (15) Erythematous thin papules/macules with gritty scale.    Assessment & Plan   Actinic Damage - Severe, confluent actinic changes with pre-cancerous actinic keratoses  - Severe, chronic, not at goal, secondary to cumulative UV radiation exposure over time - diffuse scaly erythematous macules and papules with underlying dyspigmentation - Discussed Prescription "Field Treatment" for Severe, Chronic Confluent Actinic Changes with Pre-Cancerous Actinic Keratoses Field treatment involves treatment of an entire area of skin that has confluent Actinic Changes (Sun/ Ultraviolet light damage) and PreCancerous Actinic Keratoses by method of PhotoDynamic Therapy (PDT) and/or prescription Topical Chemotherapy agents such as 5-fluorouracil, 5-fluorouracil/calcipotriene, and/or imiquimod.  The purpose is to decrease the number of clinically evident and subclinical PreCancerous lesions to prevent progression to development of skin cancer by chemically destroying early precancer changes that may or may not be  visible.  It has been shown to reduce the risk of developing skin cancer in the treated area. As a result of treatment, redness, scaling, crusting, and open sores may occur during treatment course. One or more than one of these methods may be used and may have to be used several times to control, suppress and eliminate the PreCancerous changes. Discussed treatment course, expected reaction, and possible side effects. - Recommend daily broad spectrum sunscreen SPF 30+ to sun-exposed areas, reapply every 2 hours as needed.  - Staying in the shade or wearing long sleeves, sun glasses (UVA+UVB protection) and wide brim hats (4-inch brim around the entire circumference of the hat) are also recommended. - Call for new or changing lesions.   Restart in 1 month - Start 5-fluorouracil/calcipotriene cream twice a day for 7 days to affected areas including scalp. Prescription sent to [facility]. Patient advised they will receive an email to purchase the medication online and have it sent to their home. Patient provided with handout reviewing treatment course and side effects and advised to call or message [place] on MyChart with any concerns.   AK (actinic keratosis) (15) Scalp x12, face x3  Actinic keratoses are precancerous spots that appear secondary to cumulative UV radiation exposure/sun exposure over time. They are chronic with expected duration over 1 year. A portion of actinic keratoses will progress to squamous cell carcinoma of the skin. It is not possible to reliably predict which spots will progress to skin cancer and so treatment is recommended to prevent development of skin cancer.  Recommend daily broad spectrum sunscreen SPF 30+ to sun-exposed areas, reapply every 2 hours as needed.  Recommend staying in the shade or wearing long sleeves,  sun glasses (UVA+UVB protection) and wide brim hats (4-inch brim around the entire circumference of the hat). Call for new or changing  lesions.  Destruction of lesion - Scalp x12, face x3 Complexity: simple   Destruction method: cryotherapy   Informed consent: discussed and consent obtained   Timeout:  patient name, date of birth, surgical site, and procedure verified Lesion destroyed using liquid nitrogen: Yes   Region frozen until ice ball extended beyond lesion: Yes   Outcome: patient tolerated procedure well with no complications   Post-procedure details: wound care instructions given   Additional details:  Prior to procedure, discussed risks of blister formation, small wound, skin dyspigmentation, or rare scar following cryotherapy. Recommend Vaseline ointment to treated areas while healing.   Seborrheic Keratoses - Stuck-on, waxy, tan-brown papules and/or plaques  - Benign-appearing - Discussed benign etiology and prognosis. - Observe - Call for any changes  Return in about 6 months (around 11/27/2022) for [place] Follow Up.  I, Philip Miller, CMA, am acting as scribe for Sarina Ser, MD. Documentation: I have reviewed the above documentation for accuracy and completeness, and I agree with the above.  Sarina Ser, MD

## 2022-05-28 NOTE — Patient Instructions (Addendum)
Cryotherapy Aftercare  Wash gently with soap and water everyday.   Apply Vaseline daily until healed.   Restart in 1 month - Start 5-fluorouracil/calcipotriene cream twice a day for 7 days to affected areas including scalp. Prescription sent to [facility]. Patient advised they will receive an email to purchase the medication online and have it sent to their home. Patient provided with handout reviewing treatment course and side effects and advised to call or message [place] on MyChart with any concerns.    5-Fluorouracil/Calcipotriene Patient Education   Actinic keratoses are the dry, red scaly spots on the skin caused by sun damage. A portion of these spots can turn into skin cancer with time, and treating them can help prevent development of skin cancer.   Treatment of these spots requires removal of the defective skin cells. There are various ways to remove actinic keratoses, including freezing with liquid nitrogen, treatment with creams, or treatment with a blue light procedure in the office.   5-fluorouracil cream is a topical cream used to treat actinic keratoses. It works by interfering with the growth of abnormal fast-growing skin cells, such as actinic keratoses. These cells peel off and are replaced by healthy ones.   5-fluorouracil/calcipotriene is a combination of the 5-fluorouracil cream with a vitamin D analog cream called calcipotriene. The calcipotriene alone does not treat actinic keratoses. However, when it is combined with 5-fluorouracil, it helps the 5-fluorouracil treat the actinic keratoses much faster so that the same results can be achieved with a much shorter treatment time.  INSTRUCTIONS FOR 5-FLUOROURACIL/CALCIPOTRIENE CREAM:   5-fluorouracil/calcipotriene cream typically only needs to be used for 4-7 days. A thin layer should be applied twice a day to the treatment areas recommended by your physician.   If your physician prescribed you separate  tubes of 5-fluourouracil and calcipotriene, apply a thin layer of 5-fluorouracil followed by a thin layer of calcipotriene.   Avoid contact with your eyes, nostrils, and mouth. Do not use 5-fluorouracil/calcipotriene cream on infected or open wounds.   You will develop redness, irritation and some crusting at areas where you have pre-cancer damage/actinic keratoses. IF YOU DEVELOP PAIN, BLEEDING, OR SIGNIFICANT CRUSTING, STOP THE TREATMENT EARLY - you have already gotten a good response and the actinic keratoses should clear up well.  Wash your hands after applying 5-fluorouracil 5% cream on your skin.   A moisturizer or sunscreen with a minimum SPF 30 should be applied each morning.   Once you have finished the treatment, you can apply a thin layer of Vaseline twice a day to irritated areas to soothe and calm the areas more quickly. If you experience significant discomfort, contact your physician.  For some patients it is necessary to repeat the treatment for best results.  SIDE EFFECTS: When using 5-fluorouracil/calcipotriene cream, you may have mild irritation, such as redness, dryness, swelling, or a mild burning sensation. This usually resolves within 2 weeks. The more actinic keratoses you have, the more redness and inflammation you can expect during treatment. Eye irritation has been reported rarely. If this occurs, please let us know.  If you have any trouble using this cream, please call the office. If you have any other questions about this information, please do not hesitate to ask me before you leave the office.   Due to recent changes in healthcare laws, you may see results of your pathology and/or laboratory studies on MyChart before the doctors have had a chance to review them. We understand that in  some cases there may be results that are confusing or concerning to you. Please understand that not all results are received at the same time and often the doctors may need to interpret  multiple results in order to provide you with the best plan of care or course of treatment. Therefore, we ask that you please give [place] 2 business days to thoroughly review all your results before contacting the office for clarification. Should we see a critical lab result, you will be contacted sooner.   If You Need Anything After Your Visit  If you have any questions or concerns for your doctor, please call our main line at [phone] and press option 4 to reach your doctor's medical assistant. If no one answers, please leave a voicemail as directed and we will return your call as soon as possible. Messages left after 4 pm will be answered the following business day.   You may also send [place] a message via [place]. We typically respond to MyChart messages within 1-2 business days.  For prescription refills, please ask your pharmacy to contact our office. Our fax number is [phone].  If you have an urgent issue when the clinic is closed that cannot wait until the next business day, you can page your doctor at the number below.    Please note that while we do our best to be available for urgent issues outside of office hours, we are not available 24/7.   If you have an urgent issue and are unable to reach [place], you may choose to seek medical care at your doctor's office, retail clinic, urgent care center, or emergency room.  If you have a medical emergency, please immediately call 911 or go to the emergency department.  Pager Numbers  - Dr. Nehemiah Massed: [phone]  - Dr. Laurence Ferrari: [phone]  - Dr. Nicole Kindred: [phone]  In the event of inclement weather, please call our main line at [phone] for an update on the status of any delays or closures.  Dermatology Medication Tips: Please keep the boxes that topical medications come in in order to help keep track of the instructions about where and how to use these. Pharmacies typically print the medication instructions only on the boxes and  not directly on the medication tubes.   If your medication is too expensive, please contact our office at [phone] option 4 or send [place] a message through [place].   We are unable to tell what your co-pay for medications will be in advance as this is different depending on your insurance coverage. However, we may be able to find a substitute medication at lower cost or fill out paperwork to get insurance to cover a needed medication.   If a prior authorization is required to get your medication covered by your insurance company, please allow [place] 1-2 business days to complete this process.  Drug prices often vary depending on where the prescription is filled and some pharmacies may offer cheaper prices.  The website www.goodrx.com contains coupons for medications through different pharmacies. The prices here do not account for what the cost may be with help from insurance (it may be cheaper with your insurance), but the website can give you the price if you did not use any insurance.  - You can print the associated coupon and take it with your prescription to the pharmacy.  - You may also stop by our office during regular business hours and pick up a GoodRx coupon card.  - If you need your prescription  sent electronically to a different pharmacy, notify our office through [facility] or by phone at [phone] option 4.     Si Usted Necesita Algo Despus de Su Visita  Tambin puede enviarnos un mensaje a travs de Pharmacist, community. Por lo general respondemos a los mensajes de MyChart en el transcurso de 1 a 2 das hbiles.  Para renovar recetas, por favor pida a su farmacia que se ponga en contacto con nuestra oficina. Harland Dingwall de fax es [place] [phone].  Si tiene un asunto urgente cuando la clnica est cerrada y que no puede esperar hasta el siguiente da hbil, puede llamar/localizar a su doctor(a) al nmero que aparece a continuacin.   Por favor, tenga en cuenta que aunque  hacemos todo lo posible para estar disponibles para asuntos urgentes fuera del horario de [place], no estamos disponibles las 24 horas del da, los 7 das de la [place].   Si tiene un problema urgente y no puede comunicarse con nosotros, puede optar por buscar atencin mdica  en el consultorio de su doctor(a), en una clnica privada, en un centro de atencin urgente o en una sala de emergencias.  Si tiene Engineering geologist, por favor llame inmediatamente al 911 o vaya a la sala de emergencias.  Nmeros de bper  - Dr. Nehemiah Massed: [phone]  - Dra. Moye: [phone]  - Dra. Nicole Kindred: [phone]  En caso de inclemencias del [place], por favor llame a Johnsie Kindred principal al [phone] para una actualizacin sobre el [place] de cualquier retraso o cierre.  Consejos para la medicacin en dermatologa: Por favor, guarde las cajas en las que vienen los medicamentos de uso tpico para ayudarle a seguir las instrucciones sobre dnde y cmo usarlos. Las farmacias generalmente imprimen las instrucciones del medicamento slo en las cajas y no directamente en los tubos del [place].   Si su medicamento es muy caro, por favor, pngase en contacto con Zigmund Daniel llamando al [phone] y presione la opcin 4 o envenos un mensaje a travs de Pharmacist, community.   No podemos decirle cul ser su copago por los medicamentos por adelantado ya que esto es diferente dependiendo de la cobertura de su seguro. Sin embargo, es posible que podamos encontrar un medicamento sustituto a Electrical engineer un formulario para que el seguro cubra el medicamento que se considera necesario.   Si se requiere una autorizacin previa para que su compaa de seguros [place] su medicamento, por favor permtanos de 1 a 2 das hbiles para completar este proceso.  Los precios de los medicamentos varan con frecuencia dependiendo del Environmental consultant de dnde se surte la receta y alguna farmacias pueden ofrecer precios ms  baratos.  El sitio web www.goodrx.com tiene cupones para medicamentos de Airline pilot. Los precios aqu no tienen en cuenta lo que podra costar con la ayuda del seguro (puede ser ms barato con su seguro), pero el sitio web puede darle el precio si no utiliz Research scientist (physical sciences).  - Puede imprimir el cupn correspondiente y llevarlo con su receta a la farmacia.  - Tambin puede pasar por nuestra oficina durante el horario de atencin regular y Charity fundraiser una tarjeta de cupones de GoodRx.  - Si necesita que su receta se enve electrnicamente a una farmacia diferente, informe a nuestra oficina a travs de MyChart de [place] o por telfono llamando al [phone] y presione la opcin 4.

## 2023-01-22 ENCOUNTER — Telehealth: Payer: Self-pay | Admitting: Cardiovascular Disease

## 2023-01-22 MED ORDER — HYDROCHLOROTHIAZIDE 25 MG PO TABS: 25.0000 mg | ORAL_TABLET | Freq: Every day | ORAL | 1 refills | Status: DC

## 2023-01-22 NOTE — Telephone Encounter (Signed)
Pt's medication was sent to pt's pharmacy as requested. Confirmation received.  °

## 2023-01-22 NOTE — Telephone Encounter (Signed)
*  STAT* If patient is at the pharmacy, call can be transferred to refill team.   1. Which medications need to be refilled? (please list name of each medication and dose if known)   hydrochlorothiazide (HYDRODIURIL) 25 MG tablet   2. Which pharmacy/location (including street and city if local pharmacy) is medication to be sent to?  [facility] - [place], [place] - [facility] AT Portal to Registered [facility] Sites   3. Do they need a 30 day or 90 day supply?   90 day   Patient stated he is completely out of this medication.  Patient has appointment scheduled on 10/7.

## 2023-01-30 ENCOUNTER — Ambulatory Visit: Payer: 59 | Attending: Cardiovascular Disease | Admitting: Cardiovascular Disease

## 2023-01-30 ENCOUNTER — Encounter: Payer: Self-pay | Admitting: Cardiovascular Disease

## 2023-01-30 VITALS — BP 120/80 | HR 69 | Ht 72.0 in | Wt 212.4 lb

## 2023-01-30 DIAGNOSIS — E782 Mixed hyperlipidemia: Secondary | ICD-10-CM

## 2023-01-30 DIAGNOSIS — I10 Essential (primary) hypertension: Secondary | ICD-10-CM | POA: Diagnosis not present

## 2023-01-30 NOTE — Patient Instructions (Signed)
Medication Instructions:  Your physician recommends that you continue on your current medications as directed. Please refer to the Current Medication list given to you today.  *If you need a refill on your cardiac medications before your next appointment, please call your pharmacy*  Follow-Up: At [facility], you and your health needs are our priority.  As part of our continuing mission to provide you with exceptional heart care, we have created designated Provider Care Teams.  These Care Teams include your primary Cardiologist (physician) and Advanced Practice Providers (APPs -  Physician Assistants and Nurse Practitioners) who all work together to provide you with the care you need, when you need it.  Your next appointment:   1 year(s)  Provider:   Michael Cooper, MD     

## 2023-01-30 NOTE — Progress Notes (Signed)
Cardiology Office Note:    Date:  01/30/2023   ID:  Philip Miller, DOB [birth year], MRN [id number]  PCP:  Philip Has, MD   Philip Miller Cardiologist:  Philip Bollman, MD     Referring MD: Philip Has, MD   Chief Complaint  Patient presents with   Hypertension    History of Present Illness:    Philip Miller is a 57 y.o. male presenting for annual cardiology follow-up.  The patient is followed for hypertension and mixed hyperlipidemia.  He Miller had mildly elevated LFTs in the past.  He is doing very well and is here alone today for follow-up evaluation.  He developed prediabetes and Miller done work on his diet to lose weight and improve his health.  He is lost about 10 to 15 pounds since I saw him last.  He feels good and Miller no cardiac-related symptoms.  He specifically denies chest pain, chest pressure, or shortness of breath.  No leg swelling or heart palpitations.  He continues to swim for exercise with no exertional symptoms.  Past Medical History:  Diagnosis Date   Allergic rhinitis    Dyslipidemia    Graves disease    Hypothyroidism    S/P radioactive iodine thyroid ablation    Ulnar nerve injury    left side   Vitamin D deficiency     Past Surgical History:  Procedure Laterality Date   colonscopy  1996   HAND SURGERY Right 1990   place pin in hand   LACERATION REPAIR Left 11/10/2008   WISDOM TOOTH EXTRACTION  1986    Current Medications: Current Meds  Medication Sig   aspirin EC 81 MG tablet Take 1 tablet (81 mg total) by mouth daily.   atorvastatin (LIPITOR) 20 MG tablet Take 20 mg by mouth daily.   Cholecalciferol (VITAMIN D3) 5000 units CAPS Take 5,000 Units by mouth daily.   fluticasone (VERAMYST) 27.5 MCG/SPRAY nasal spray Place 2 sprays into the nose as needed.   hydrochlorothiazide (HYDRODIURIL) 25 MG tablet Take 1 tablet (25 mg total) by mouth daily.   levothyroxine (SYNTHROID) 125 MCG tablet 2 tablets 6 days a week; 1 1/2 tablets  1 day/week.   loratadine (CLARITIN) 10 MG tablet Take 10 mg by mouth as needed.   Multiple Vitamin (MULTIVITAMIN) capsule Take 1 capsule by mouth daily.   valACYclovir (VALTREX) 500 MG tablet 1,000 mg 2 (two) times daily as needed (fever blister outbreaks). TAKE 2 TABLETS BY MOUTH DAILY EVERY 12 HRS AS NEEDED   valsartan (DIOVAN) 160 MG tablet Take 160 mg by mouth daily.     Allergies:   Patient Miller no known allergies.   Social History   Socioeconomic History   Marital status: Married    Spouse name: Not on file   Number of children: Not on file   Years of education: Not on file   Highest education level: Not on file  Occupational History   Not on file  Tobacco Use   Smoking status: Never   Smokeless tobacco: Never  Substance and Sexual Activity   Alcohol use: Yes    Comment: OCCASIONALLY   Drug use: No   Sexual activity: Yes  Other Topics Concern   Not on file  Social History Narrative   Not on file   Social Determinants of Health   Financial Resource Strain: Not on file  Food Insecurity: Not on file  Transportation Needs: Not on file  Physical Activity: Not on file  Stress: Not  on file  Social Connections: Not on file     Family History: The patient's family history includes Diabetes type I in his cousin; Healthy in his sister; Heart attack in his maternal grandfather; Heart disease in his mother; Hyperthyroidism in his paternal grandmother; Hypothyroidism in his maternal aunt; Lymphoma in his maternal grandmother.  ROS:   Please see the history of present illness.    All other systems reviewed and are negative.  EKGs/Labs/Other Studies Reviewed:    The following studies were reviewed today: Cardiac Studies & Procedures     STRESS TESTS  EXERCISE TOLERANCE TEST (ETT) 05/07/2016  Narrative  Blood pressure demonstrated a hypertensive response to exercise.  No T wave inversion was noted during stress.  There was no ST segment deviation noted during  stress.  Hypertensive response to exercise. Otherwise normal stress ECG test.      CT SCANS  CT CARDIAC SCORING (SELF PAY ONLY) 05/07/2016  Addendum 05/07/2016 10:19 AM ADDENDUM REPORT: 05/07/2016 10:17  CLINICAL DATA:  Risk stratification  EXAM: Coronary Calcium Score  TECHNIQUE: The patient was scanned on a Siemens Somatom 64 slice scanner. Axial non-contrast 3mm slices were carried out through the heart. The data set was analyzed on a dedicated work station and scored using the Agatson method.  FINDINGS: Non-cardiac: Distal esophageal thickening See separate report from [facility].  Ascending Aorta:  3.4 cm  Pericardium: Normal  Coronary arteries:  Mild calcium seen in proximal to mid RCA  IMPRESSION: Coronary calcium score of 7. This was 52nd percentile for age and sex matched control.  Charlton Haws   Electronically Signed By: Charlton Haws M.D. On: 05/07/2016 10:17  Narrative EXAM: OVER-READ INTERPRETATION  CT CHEST  The following report is an over-read performed by radiologist Dr. Maryelizabeth Rowan [facility] on 05/04/2016. This over-read does not include interpretation of cardiac or coronary anatomy or pathology. The coronary calcium score interpretation by the cardiologist is attached.  COMPARISON:  None.  FINDINGS: Limited view of the lung parenchyma demonstrates no suspicious nodularity. Airways are normal.  Limited view of the mediastinum demonstrates no adenopathy.  Limited view of the upper abdomen demonstrates mild circumferential thickening of the distal esophagus to 9 mm.  Limited view of the skeleton and chest wall is unremarkable.  IMPRESSION: 1. Circumferential thickening of the distal esophagus. Recommend clinical correlation with dysphagia and consider evaluation for esophagitis. 2. No additional significant extracardiac findings.  Electronically Signed: By: Genevive Bi M.D. On: 05/04/2016 16:08            EKG:  EKG is ordered today.  The ekg ordered today demonstrates normal sinus rhythm 69 bpm, within normal limits.  Recent Labs: No results found for requested labs within last 365 days.  Recent Lipid Panel No results found for: "CHOL", "TRIG", "HDL", "CHOLHDL", "VLDL", "LDLCALC", "LDLDIRECT"   Risk Assessment/Calculations:                Physical Exam:    VS:  BP 120/80   Pulse 69   Ht 6' (1.829 m)   Wt 212 lb 6.4 oz (96.3 kg)   SpO2 98%   BMI 28.81 kg/m     Wt Readings from Last 3 Encounters:  01/30/23 212 lb 6.4 oz (96.3 kg)  10/26/21 225 lb (102.1 kg)  10/19/20 217 lb 3.2 oz (98.5 kg)     GEN:  Well nourished, well developed in no acute distress HEENT: Normal NECK: No JVD; No carotid bruits LYMPHATICS: No lymphadenopathy CARDIAC:  RRR, no murmurs, rubs, gallops RESPIRATORY:  Clear to auscultation without rales, wheezing or rhonchi  ABDOMEN: Soft, non-tender, non-distended MUSCULOSKELETAL:  No edema; No deformity  SKIN: Warm and dry NEUROLOGIC:  Alert and oriented x 3 PSYCHIATRIC:  Normal affect   ASSESSMENT:    1. Essential hypertension   2. Mixed hyperlipidemia    PLAN:    In order of problems listed above:  Blood pressure is well-controlled on valsartan and hydrochlorothiazide.  I think weight loss is really helped him as well and we discussed that the best way to minimize his medication list is to continue to control his weight.  He will follow-up in 1 year. Treated with atorvastatin 20 mg daily.  Last lipids showed a cholesterol 159, HDL 48, LDL 96.     Medication Adjustments/Labs and Tests Ordered: Current medicines are reviewed at length with the patient today.  Concerns regarding medicines are outlined above.  Orders Placed This Encounter  Procedures   EKG 12-Lead   No orders of the defined types were placed in this encounter.   Patient Instructions  Medication Instructions:  Your physician recommends that you continue on your  current medications as directed. Please refer to the Current Medication list given to you today.  *If you need a refill on your cardiac medications before your next appointment, please call your pharmacy*   Follow-Up: At [facility], you and your health needs are our priority.  As part of our continuing mission to provide you with exceptional heart care, we have created designated Provider Care Teams.  These Care Teams include your primary Cardiologist (physician) and Advanced Practice [place] (APPs -  Physician Assistants and Nurse Practitioners) who all work together to provide you with the care you need, when you need it.   Your next appointment:   1 year(s)  Provider:   Tonny Bollman, MD        Signed, Philip Bollman, MD  01/30/2023 5:09 PM    [facility]

## 2023-03-05 ENCOUNTER — Ambulatory Visit: Payer: 59 | Admitting: Dermatology

## 2023-06-03 ENCOUNTER — Ambulatory Visit: Payer: 59 | Admitting: Cardiovascular Disease

## 2023-07-01 ENCOUNTER — Ambulatory Visit: Payer: 59 | Admitting: Dermatology

## 2023-07-01 ENCOUNTER — Other Ambulatory Visit: Payer: Self-pay | Admitting: Cardiovascular Disease

## 2023-07-01 ENCOUNTER — Encounter: Payer: Self-pay | Admitting: Dermatology

## 2023-07-01 DIAGNOSIS — L820 Inflamed seborrheic keratosis: Secondary | ICD-10-CM

## 2023-07-01 DIAGNOSIS — L570 Actinic keratosis: Secondary | ICD-10-CM | POA: Diagnosis not present

## 2023-07-01 DIAGNOSIS — W908XXA Exposure to other nonionizing radiation, initial encounter: Secondary | ICD-10-CM

## 2023-07-01 NOTE — Patient Instructions (Addendum)
Cryotherapy Aftercare  Wash gently with soap and water everyday.   Apply Vaseline Jelly daily until healed.   If not resolved in 1-2 months call for appointment for biopsy of lesion.   Recommend daily broad spectrum sunscreen SPF 30+ to sun-exposed areas, reapply every 2 hours as needed. Call for new or changing lesions.  Staying in the shade or wearing long sleeves, sun glasses (UVA+UVB protection) and wide brim hats (4-inch brim around the entire circumference of the hat) are also recommended for sun protection.     Due to recent changes in healthcare laws, you may see results of your pathology and/or laboratory studies on MyChart before the doctors have had a chance to review them. We understand that in some cases there may be results that are confusing or concerning to you. Please understand that not all results are received at the same time and often the doctors may need to interpret multiple results in order to provide you with the best plan of care or course of treatment. Therefore, we ask that you please give [place] 2 business days to thoroughly review all your results before contacting the office for clarification. Should we see a critical lab result, you will be contacted sooner.   If You Need Anything After Your Visit  If you have any questions or concerns for your doctor, please call our main line at [phone] and press option 4 to reach your doctor's medical assistant. If no one answers, please leave a voicemail as directed and we will return your call as soon as possible. Messages left after 4 pm will be answered the following business day.   You may also send [place] a message via MyChart. We typically respond to MyChart messages within 1-2 business days.  For prescription refills, please ask your pharmacy to contact our office. Our fax number is [phone].  If you have an urgent issue when the clinic is closed that cannot wait until the next business day, you can page your doctor  at the number below.    Please note that while we do our best to be available for urgent issues outside of office hours, we are not available 24/7.   If you have an urgent issue and are unable to reach [place], you may choose to seek medical care at your doctor's office, retail clinic, urgent care center, or emergency room.  If you have a medical emergency, please immediately call 911 or go to the emergency department.  Pager Numbers  - Dr. Gwen Pounds: [phone]  - Dr. Roseanne Reno: [phone]  - Dr. Katrinka Blazing: [phone]   In the event of inclement weather, please call our main line at [phone] for an update on the status of any delays or closures.  Dermatology Medication Tips: Please keep the boxes that topical medications come in in order to help keep track of the instructions about where and how to use these. Pharmacies typically print the medication instructions only on the boxes and not directly on the medication tubes.   If your medication is too expensive, please contact our office at [phone] option 4 or send [place] a message through MyChart.   We are unable to tell what your co-pay for medications will be in advance as this is different depending on your insurance coverage. However, we may be able to find a substitute medication at lower cost or fill out paperwork to get insurance to cover a needed medication.   If a prior authorization is required to get your medication covered  by your insurance company, please allow [place] 1-2 business days to complete this process.  Drug prices often vary depending on where the prescription is filled and some pharmacies may offer cheaper prices.  The website www.goodrx.com contains coupons for medications through different pharmacies. The prices here do not account for what the cost may be with help from insurance (it may be cheaper with your insurance), but the website can give you the price if you did not use any insurance.  - You can print the  associated coupon and take it with your prescription to the pharmacy.  - You may also stop by our office during regular business hours and pick up a GoodRx coupon card.  - If you need your prescription sent electronically to a different pharmacy, notify our office through [facility] or by phone at [phone] option 4.     Si Usted Necesita Algo Despus de Su Visita  Tambin puede enviarnos un mensaje a travs de Clinical cytogeneticist. Por lo general respondemos a los mensajes de MyChart en el transcurso de 1 a 2 das hbiles.  Para renovar recetas, por favor pida a su farmacia que se ponga en contacto con nuestra oficina. Annie Sable de fax es [place] [phone].  Si tiene un asunto urgente cuando la clnica est cerrada y que no puede esperar hasta el siguiente da hbil, puede llamar/localizar a su doctor(a) al nmero que aparece a continuacin.   Por favor, tenga en cuenta que aunque hacemos todo lo posible para estar disponibles para asuntos urgentes fuera del horario de [place], no estamos disponibles las 24 horas del da, los 7 [address]  [address] de la [place].   Si tiene un problema urgente y no puede comunicarse con nosotros, puede optar por buscar atencin mdica  en el consultorio de su doctor(a), en una clnica privada, en un centro de atencin urgente o en una sala de emergencias.  Si tiene Engineer, drilling, por favor llame inmediatamente al 911 o vaya a la sala de emergencias.  Nmeros de bper  - Dr. Gwen Pounds: [phone]  - Dra. Roseanne Reno: [phone]  - Dr. Katrinka Blazing: [phone]   En caso de inclemencias del tiempo, por favor llame a Lacy Duverney principal al [phone] para una actualizacin sobre el [place] de cualquier retraso o cierre.  Consejos para la medicacin en dermatologa: Por favor, guarde las cajas en las que vienen los medicamentos de uso tpico para ayudarle a seguir las instrucciones sobre dnde y cmo usarlos. Las farmacias generalmente imprimen las instrucciones  del medicamento slo en las cajas y no directamente en los tubos del [place].   Si su medicamento es muy caro, por favor, pngase en contacto con Rolm Gala llamando al [phone] y presione la opcin 4 o envenos un mensaje a travs de Clinical cytogeneticist.   No podemos decirle cul ser su copago por los medicamentos por adelantado ya que esto es diferente dependiendo de la cobertura de su seguro. Sin embargo, es posible que podamos encontrar un medicamento sustituto a Audiological scientist un formulario para que el seguro cubra el medicamento que se considera necesario.   Si se requiere una autorizacin previa para que su compaa de seguros [place] su medicamento, por favor permtanos de 1 a 2 das hbiles para completar [address]  Los precios de los medicamentos varan con frecuencia dependiendo del Environmental consultant de dnde se surte la receta y alguna farmacias pueden ofrecer precios ms baratos.  El sitio web www.goodrx.com tiene cupones para medicamentos de Health and safety inspector. Los precios aqu  no tienen en cuenta lo que podra costar con la ayuda del seguro (puede ser ms barato con su seguro), pero el sitio web puede darle el precio si no Visual merchandiser.  - Puede imprimir el cupn correspondiente y llevarlo con su receta a la farmacia.  - Tambin puede pasar por nuestra oficina durante el horario de atencin regular y Education officer, museum una tarjeta de cupones de GoodRx.  - Si necesita que su receta se enve electrnicamente a una farmacia diferente, informe a nuestra oficina a travs de MyChart de [place] o por telfono llamando al [phone] y presione la opcin 4.

## 2023-07-01 NOTE — Progress Notes (Signed)
Follow-Up Visit   Subjective  Philip Miller is a 57 y.o. male who presents for the following: AK recheck. Scalp and face. Has used 5FU/Calcipotriene in the past. States he used it a couple of months ago.  Last visit 05/28/2022.  Check spot on right dorsal hand. Dur: couple of months. Looked like a blister at first, was painful. Thought was a spider bite. Patient states he ruptured the blister then the area healed with a thick white crust. Wife peeled it this weekend so it does look better now.   The patient has spots, moles and lesions to be evaluated, some may be new or changing and the patient may have concern these could be cancer.   The following portions of the chart were reviewed this encounter and updated as appropriate: medications, allergies, medical history  Review of Systems:  No other skin or systemic complaints except as noted in HPI or Assessment and Plan.  Objective  Well appearing patient in no apparent distress; mood and affect are within normal limits.  A focused examination was performed of the following areas: Scalp, face, hands  Relevant exam findings are noted in the Assessment and Plan.  Scalp x6, L helix x3, R helix x2, R temple x1, L temple x1, L cheek x1, L dorsal hand x1 (15) Erythematous thin papules/macules with gritty scale.   Right Hand - Posterior Erythematous keratotic or waxy stuck-on papule or plaque.    Assessment & Plan     AK (actinic keratosis) (15) Scalp x6, L helix x3, R helix x2, R temple x1, L temple x1, L cheek x1, L dorsal hand x1  Actinic keratoses are precancerous spots that appear secondary to cumulative UV radiation exposure/sun exposure over time. They are chronic with expected duration over 1 year. A portion of actinic keratoses will progress to squamous cell carcinoma of the skin. It is not possible to reliably predict which spots will progress to skin cancer and so treatment is recommended to prevent development of skin  cancer.  Recommend daily broad spectrum sunscreen SPF 30+ to sun-exposed areas, reapply every 2 hours as needed.  Recommend staying in the shade or wearing long sleeves, sun glasses (UVA+UVB protection) and wide brim hats (4-inch brim around the entire circumference of the hat). Call for new or changing lesions.  Destruction of lesion - Scalp x6, L helix x3, R helix x2, R temple x1, L temple x1, L cheek x1, L dorsal hand x1 (15) Complexity: simple   Destruction method: cryotherapy   Informed consent: discussed and consent obtained   Timeout:  patient name, date of birth, surgical site, and procedure verified Lesion destroyed using liquid nitrogen: Yes   Region frozen until ice ball extended beyond lesion: Yes   Cryo cycles: 1 or 2. Outcome: patient tolerated procedure well with no complications   Post-procedure details: wound care instructions given   Additional details:  Prior to procedure, discussed risks of blister formation, small wound, skin dyspigmentation, or rare scar following cryotherapy. Recommend Vaseline ointment to treated areas while healing.   Inflamed seborrheic keratosis Right Hand - Posterior  Symptomatic, irritating, patient would like treated.    If not resolved in 1-2 months call for appointment for biopsy of lesion.   Destruction of lesion - Right Hand - Posterior Complexity: simple   Destruction method: cryotherapy   Informed consent: discussed and consent obtained   Timeout:  patient name, date of birth, surgical site, and procedure verified Lesion destroyed using liquid nitrogen: Yes  Region frozen until ice ball extended beyond lesion: Yes   Cryotherapy cycles:  2 (1 or 2) Outcome: patient tolerated procedure well with no complications   Post-procedure details: wound care instructions given   Additional details:  Prior to procedure, discussed risks of blister formation, small wound, skin dyspigmentation, or rare scar following cryotherapy. Recommend  Vaseline ointment to treated areas while healing.     Return in about 6 months (around 12/29/2023) for AK Follow Up.  I, Lawson Radar, CMA, am acting as scribe for Elie Goody, MD.   Documentation: I have reviewed the above documentation for accuracy and completeness, and I agree with the above.  Elie Goody, MD

## 2023-12-26 ENCOUNTER — Ambulatory Visit: Payer: 59 | Admitting: Dermatology

## 2023-12-30 ENCOUNTER — Ambulatory Visit: Payer: 59 | Admitting: Dermatology

## 2024-01-09 ENCOUNTER — Ambulatory Visit: Admitting: Dermatology

## 2024-01-13 ENCOUNTER — Ambulatory Visit: Admitting: Dermatology

## 2024-01-13 ENCOUNTER — Encounter: Payer: Self-pay | Admitting: Dermatology

## 2024-01-13 DIAGNOSIS — W908XXA Exposure to other nonionizing radiation, initial encounter: Secondary | ICD-10-CM

## 2024-01-13 DIAGNOSIS — L578 Other skin changes due to chronic exposure to nonionizing radiation: Secondary | ICD-10-CM

## 2024-01-13 DIAGNOSIS — L570 Actinic keratosis: Secondary | ICD-10-CM

## 2024-01-13 NOTE — Patient Instructions (Signed)

## 2024-01-13 NOTE — Progress Notes (Signed)
   Follow-Up Visit   Subjective  Philip Miller is a 58 y.o. male who presents for the following: AK f/u - previously tx with LN2 at last visit, pt hasn't noticed any particular skin lesions today that he is concerned about. Pt did notice some scaly lesions on the scalp a few weeks ago so he used 5FU/Calcipotriene mix for one week. He did notice some blistering during that duration of using the medication.  The following portions of the chart were reviewed this encounter and updated as appropriate: medications, allergies, medical history  Review of Systems:  No other skin or systemic complaints except as noted in HPI or Assessment and Plan.  Objective  Well appearing patient in no apparent distress; mood and affect are within normal limits.   A focused examination was performed of the following areas:   Relevant exam findings are noted in the Assessment and Plan.  Scalp and ears Erythematous thin papules/macules with gritty scale.   Assessment & Plan   ACTINIC DAMAGE - chronic, secondary to cumulative UV radiation exposure/sun exposure over time - diffuse scaly erythematous macules with underlying dyspigmentation - Recommend daily broad spectrum sunscreen SPF 30+ to sun-exposed areas, reapply every 2 hours as needed.  - Recommend staying in the shade or wearing long sleeves, sun glasses (UVA+UVB protection) and wide brim hats (4-inch brim around the entire circumference of the hat). - Call for new or changing lesions.  AK (ACTINIC KERATOSIS) Scalp and ears Actinic keratoses are precancerous spots that appear secondary to cumulative UV radiation exposure/sun exposure over time. They are chronic with expected duration over 1 year. A portion of actinic keratoses will progress to squamous cell carcinoma of the skin. It is not possible to reliably predict which spots will progress to skin cancer and so treatment is recommended to prevent development of skin cancer.  Recommend daily broad  spectrum sunscreen SPF 30+ to sun-exposed areas, reapply every 2 hours as needed.  Recommend staying in the shade or wearing long sleeves, sun glasses (UVA+UVB protection) and wide brim hats (4-inch brim around the entire circumference of the hat). Call for new or changing lesions.  Apply 5FU/Calcipotriene mix BID x until irritation occurs then stop. ACTINIC ELASTOSIS    Return in about 6 months (around 07/15/2024) for TBSE, AK follow up.  Arlinda Lais, CMA, am acting as scribe for Harris Liming, MD .   Documentation: I have reviewed the above documentation for accuracy and completeness, and I agree with the above.  Harris Liming, MD

## 2024-04-03 ENCOUNTER — Other Ambulatory Visit: Payer: Self-pay | Admitting: Cardiovascular Disease

## 2024-06-25 ENCOUNTER — Other Ambulatory Visit: Payer: Self-pay | Admitting: Cardiovascular Disease

## 2024-07-13 ENCOUNTER — Ambulatory Visit: Admitting: Dermatology

## 2024-07-13 ENCOUNTER — Encounter: Payer: Self-pay | Admitting: Dermatology

## 2024-07-13 DIAGNOSIS — L570 Actinic keratosis: Secondary | ICD-10-CM | POA: Diagnosis not present

## 2024-07-13 DIAGNOSIS — J309 Allergic rhinitis, unspecified: Secondary | ICD-10-CM | POA: Insufficient documentation

## 2024-07-13 DIAGNOSIS — W908XXA Exposure to other nonionizing radiation, initial encounter: Secondary | ICD-10-CM | POA: Diagnosis not present

## 2024-07-13 DIAGNOSIS — L814 Other melanin hyperpigmentation: Secondary | ICD-10-CM

## 2024-07-13 DIAGNOSIS — D1801 Hemangioma of skin and subcutaneous tissue: Secondary | ICD-10-CM

## 2024-07-13 DIAGNOSIS — D229 Melanocytic nevi, unspecified: Secondary | ICD-10-CM

## 2024-07-13 DIAGNOSIS — K760 Fatty (change of) liver, not elsewhere classified: Secondary | ICD-10-CM | POA: Insufficient documentation

## 2024-07-13 DIAGNOSIS — L821 Other seborrheic keratosis: Secondary | ICD-10-CM

## 2024-07-13 DIAGNOSIS — E890 Postprocedural hypothyroidism: Secondary | ICD-10-CM | POA: Insufficient documentation

## 2024-07-13 DIAGNOSIS — Z1283 Encounter for screening for malignant neoplasm of skin: Secondary | ICD-10-CM

## 2024-07-13 DIAGNOSIS — Z860100 Personal history of colon polyps, unspecified: Secondary | ICD-10-CM | POA: Insufficient documentation

## 2024-07-13 DIAGNOSIS — E785 Hyperlipidemia, unspecified: Secondary | ICD-10-CM | POA: Insufficient documentation

## 2024-07-13 DIAGNOSIS — L578 Other skin changes due to chronic exposure to nonionizing radiation: Secondary | ICD-10-CM

## 2024-07-13 NOTE — Progress Notes (Signed)
 Follow-Up Visit   Subjective  Philip Miller is a 58 y.o. male who presents for the following: Skin Cancer Screening and Full Body Skin Exam. No personal Hx of skin cancer. Hx of AKs.  Status post 5FU/Calcipotriene treatment on scalp.   The patient presents for Total-Body Skin Exam (TBSE) for skin cancer screening and mole check. The patient has spots, moles and lesions to be evaluated, some may be new or changing and the patient may have concern these could be cancer.    The following portions of the chart were reviewed this encounter and updated as appropriate: medications, allergies, medical history  Review of Systems:  No other skin or systemic complaints except as noted in HPI or Assessment and Plan.  Objective  Well appearing patient in no apparent distress; mood and affect are within normal limits.  A full examination was performed including scalp, head, eyes, ears, nose, lips, neck, chest, axillae, abdomen, back, buttocks, bilateral upper extremities, bilateral lower extremities, hands, feet, fingers, toes, fingernails, and toenails. All findings within normal limits unless otherwise noted below.   Relevant physical exam findings are noted in the Assessment and Plan.    Assessment & Plan   SKIN CANCER SCREENING PERFORMED TODAY.  ACTINIC DAMAGE WITH PRECANCEROUS ACTINIC KERATOSES Counseling for Topical Chemotherapy Management: Patient exhibits: - Severe, confluent actinic changes with pre-cancerous actinic keratoses that is secondary to cumulative UV radiation exposure over time on scalp temples nose ears - Condition that is severe; chronic, not at goal. - diffuse scaly erythematous macules and papules with underlying dyspigmentation - Discussed Prescription Field Treatment topical Chemotherapy for Severe, Chronic Confluent Actinic Changes with Pre-Cancerous Actinic Keratoses Field treatment involves treatment of an entire area of skin that has confluent Actinic Changes  (Sun/ Ultraviolet light damage) and PreCancerous Actinic Keratoses by method of PhotoDynamic Therapy (PDT) and/or prescription Topical Chemotherapy agents such as 5-fluorouracil, 5-fluorouracil/calcipotriene, and/or imiquimod.  The purpose is to decrease the number of clinically evident and subclinical PreCancerous lesions to prevent progression to development of skin cancer by chemically destroying early precancer changes that may or may not be visible.  It has been shown to reduce the risk of developing skin cancer in the treated area. As a result of treatment, redness, scaling, crusting, and open sores may occur during treatment course. One or more than one of these methods may be used and may have to be used several times to control, suppress and eliminate the PreCancerous changes. Discussed treatment course, expected reaction, and possible side effects. - Recommend daily broad spectrum sunscreen SPF 30+ to sun-exposed areas, reapply every 2 hours as needed.  - Staying in the shade or wearing long sleeves, sun glasses (UVA+UVB protection) and wide brim hats (4-inch brim around the entire circumference of the hat) are also recommended. - Call for new or changing lesions. - Apply skinmedicinals 5FU/Calcipotriene mix twice daily until irritation occurs then stop. Scalp, ears, temples, nose. Reviewed course of treatment and expected reaction.  Patient advised to expect inflammation and crusting and advised that erosions are possible.  Patient advised to be diligent with sun protection during and after treatment. Handout with details of how to apply medication and what to expect provided. Counseled to keep medication out of reach of children and pets.   LENTIGINES, SEBORRHEIC KERATOSES, HEMANGIOMAS - Benign normal skin lesions - Benign-appearing - Call for any changes  MELANOCYTIC NEVI - Tan-brown and/or pink-flesh-colored symmetric macules and papules - Benign appearing on exam today - Observation -  Call clinic  for new or changing moles - Recommend daily use of broad spectrum spf 30+ sunscreen to sun-exposed areas.       ACTINIC KERATOSES   ACTINIC ELASTOSIS   MULTIPLE BENIGN NEVI   LENTIGINES   SEBORRHEIC KERATOSES   CHERRY ANGIOMA   Return in about 1 year (around 07/13/2025) for TBSE, AK Follow Up in 4-6 months.  I, Jill Parcell, CMA, am acting as scribe for Boneta Sharps, MD.   Documentation: I have reviewed the above documentation for accuracy and completeness, and I agree with the above.  Boneta Sharps, MD

## 2024-07-13 NOTE — Patient Instructions (Addendum)
 Apply 5FU/Calcipotriene mix twice daily until irritation occurs then stop. Scalp, nose, ears, temples. Reviewed course of treatment and expected reaction.  Patient advised to expect inflammation and crusting and advised that erosions are possible.  Patient advised to be diligent with sun protection during and after treatment. Handout with details of how to apply medication and what to expect provided. Counseled to keep medication out of reach of children and pets.    Recommend daily broad spectrum sunscreen SPF 30+ to sun-exposed areas, reapply every 2 hours as needed. Call for new or changing lesions.  Staying in the shade or wearing long sleeves, sun glasses (UVA+UVB protection) and wide brim hats (4-inch brim around the entire circumference of the hat) are also recommended for sun protection.      Melanoma ABCDEs  Melanoma is the most dangerous type of skin cancer, and is the leading cause of death from skin disease.  You are more likely to develop melanoma if you: Have light-colored skin, light-colored eyes, or red or blond hair Spend a lot of time in the sun Tan regularly, either outdoors or in a tanning bed Have had blistering sunburns, especially during childhood Have a close family member who has had a melanoma Have atypical moles or large birthmarks  Early detection of melanoma is key since treatment is typically straightforward and cure rates are extremely high if we catch it early.   The first sign of melanoma is often a change in a mole or a new dark spot.  The ABCDE system is a way of remembering the signs of melanoma.  A for asymmetry:  The two halves do not match. B for border:  The edges of the growth are irregular. C for color:  A mixture of colors are present instead of an even brown color. D for diameter:  Melanomas are usually (but not always) greater than 6mm - the size of a pencil eraser. E for evolution:  The spot keeps changing in size, shape, and color.  Please  check your skin once per month between visits. You can use a small mirror in front and a large mirror behind you to keep an eye on the back side or your body.   If you see any new or changing lesions before your next follow-up, please call to schedule a visit.  Please continue daily skin protection including broad spectrum sunscreen SPF 30+ to sun-exposed areas, reapplying every 2 hours as needed when you're outdoors.   Staying in the shade or wearing long sleeves, sun glasses (UVA+UVB protection) and wide brim hats (4-inch brim around the entire circumference of the hat) are also recommended for sun protection.      Due to recent changes in healthcare laws, you may see results of your pathology and/or laboratory studies on MyChart before the doctors have had a chance to review them. We understand that in some cases there may be results that are confusing or concerning to you. Please understand that not all results are received at the same time and often the doctors may need to interpret multiple results in order to provide you with the best plan of care or course of treatment. Therefore, we ask that you please give us  2 business days to thoroughly review all your results before contacting the office for clarification. Should we see a critical lab result, you will be contacted sooner.   If You Need Anything After Your Visit  If you have any questions or concerns for your doctor, please call  our main line at [phone] and press option 4 to reach your doctor's medical assistant. If no one answers, please leave a voicemail as directed and we will return your call as soon as possible. Messages left after 4 pm will be answered the following business day.   You may also send us  a message via MyChart. We typically respond to MyChart messages within 1-2 business days.  For prescription refills, please ask your pharmacy to contact our office. Our fax number is [phone].  If you have an urgent  issue when the clinic is closed that cannot wait until the next business day, you can page your doctor at the number below.    Please note that while we do our best to be available for urgent issues outside of office hours, we are not available 24/7.   If you have an urgent issue and are unable to reach us , you may choose to seek medical care at your doctor's office, retail clinic, urgent care center, or emergency room.  If you have a medical emergency, please immediately call 911 or go to the emergency department.  Pager Numbers  - Dr. Hester: [phone]  - Dr. Jackquline: [phone]  - Dr. Claudene: [phone]   - Dr. Raymund: [phone]  In the event of inclement weather, please call our main line at [phone] for an update on the status of any delays or closures.  Dermatology Medication Tips: Please keep the boxes that topical medications come in in order to help keep track of the instructions about where and how to use these. Pharmacies typically print the medication instructions only on the boxes and not directly on the medication tubes.   If your medication is too expensive, please contact our office at [phone] option 4 or send us  a message through MyChart.   We are unable to tell what your co-pay for medications will be in advance as this is different depending on your insurance coverage. However, we may be able to find a substitute medication at lower cost or fill out paperwork to get insurance to cover a needed medication.   If a prior authorization is required to get your medication covered by your insurance company, please allow us  1-2 business days to complete this process.  Drug prices often vary depending on where the prescription is filled and some pharmacies may offer cheaper prices.  The website www.goodrx.com contains coupons for medications through different pharmacies. The prices here do not account for what the cost may be with help from insurance (it  may be cheaper with your insurance), but the website can give you the price if you did not use any insurance.  - You can print the associated coupon and take it with your prescription to the pharmacy.  - You may also stop by our office during regular business hours and pick up a GoodRx coupon card.  - If you need your prescription sent electronically to a different pharmacy, notify our office through [facility] or by phone at [phone] option 4.     Si Usted Necesita Algo Despus de Su Visita  Tambin puede enviarnos un mensaje a travs de Clinical Cytogeneticist. Por lo general respondemos a los mensajes de MyChart en el transcurso de 1 a 2 das hbiles.  Para renovar recetas, por favor pida a su farmacia que se ponga en contacto con nuestra oficina. Randi lakes de fax es [place] [phone].  Si tiene un asunto urgente cuando la clnica est cerrada y que no puede esperar  hasta el siguiente da hbil, puede llamar/localizar a su doctor(a) al nmero que aparece a continuacin.   Por favor, tenga en cuenta que aunque hacemos todo lo posible para estar disponibles para asuntos urgentes fuera del horario de [place], no estamos disponibles las 24 horas del da, los 7 [address]  [address] de la [place].   Si tiene un problema urgente y no puede comunicarse con nosotros, puede optar por buscar atencin mdica  en el consultorio de su doctor(a), en una clnica privada, en un centro de atencin urgente o en una sala de emergencias.  Si tiene engineer, drilling, por favor llame inmediatamente al 911 o vaya a la sala de emergencias.  Nmeros de bper  - Dr. Hester: [phone]  - Dra. Jackquline: [phone]  - Dr. Claudene: [phone]  - Dra. Kitts: [phone]  En caso de inclemencias del [place], por favor llame a nuestra lnea principal al [phone] para una actualizacin sobre el estado de cualquier retraso o cierre.  Consejos para la medicacin en dermatologa: Por favor, guarde las cajas en las que  vienen los medicamentos de uso tpico para ayudarle a seguir las instrucciones sobre dnde y cmo usarlos. Las farmacias generalmente imprimen las instrucciones del medicamento slo en las cajas y no directamente en los tubos del [place].   Si su medicamento es muy caro, por favor, pngase en contacto con landry rieger llamando al [phone] y presione la opcin 4 o envenos un mensaje a travs de Clinical Cytogeneticist.   No podemos decirle cul ser su copago por los medicamentos por adelantado ya que esto es diferente dependiendo de la cobertura de su seguro. Sin embargo, es posible que podamos encontrar un medicamento sustituto a audiological scientist un formulario para que el seguro cubra el medicamento que se considera necesario.   Si se requiere una autorizacin previa para que su compaa de seguros cubra su medicamento, por favor permtanos de 1 a 2 das hbiles para completar este proceso.  Los precios de los medicamentos varan con frecuencia dependiendo del environmental consultant de dnde se surte la receta y alguna farmacias pueden ofrecer precios ms baratos.  El sitio web www.goodrx.com tiene cupones para medicamentos de health and safety inspector. Los precios aqu no tienen en cuenta lo que podra costar con la ayuda del seguro (puede ser ms barato con su seguro), pero el sitio web puede darle el precio si no utiliz tourist information centre manager.  - Puede imprimir el cupn correspondiente y llevarlo con su receta a la farmacia.  - Tambin puede pasar por nuestra oficina durante el horario de atencin regular y education officer, museum una tarjeta de cupones de GoodRx.  - Si necesita que su receta se enve electrnicamente a una farmacia diferente, informe a nuestra oficina a travs de MyChart de [place] o por telfono llamando al [phone] y presione la opcin 4.

## 2024-08-27 ENCOUNTER — Other Ambulatory Visit: Payer: Self-pay | Admitting: Cardiovascular Disease

## 2024-09-22 ENCOUNTER — Other Ambulatory Visit: Payer: Self-pay | Admitting: Cardiovascular Disease

## 2024-11-09 ENCOUNTER — Ambulatory Visit: Admitting: Dermatology
